# Patient Record
Sex: Male | Born: 1935 | Race: White | Hispanic: No | Smoking: Never smoker
Health system: Southern US, Community
[De-identification: ages and names within clinical notes are randomized; demographics above are authoritative.]

## PROBLEM LIST (undated history)

## (undated) DIAGNOSIS — M1990 Unspecified osteoarthritis, unspecified site: Secondary | ICD-10-CM

## (undated) DIAGNOSIS — H9190 Unspecified hearing loss, unspecified ear: Secondary | ICD-10-CM

## (undated) DIAGNOSIS — E785 Hyperlipidemia, unspecified: Secondary | ICD-10-CM

## (undated) DIAGNOSIS — I350 Nonrheumatic aortic (valve) stenosis: Secondary | ICD-10-CM

## (undated) DIAGNOSIS — I10 Essential (primary) hypertension: Secondary | ICD-10-CM

## (undated) DIAGNOSIS — D649 Anemia, unspecified: Secondary | ICD-10-CM

## (undated) DIAGNOSIS — N289 Disorder of kidney and ureter, unspecified: Secondary | ICD-10-CM

## (undated) DIAGNOSIS — E119 Type 2 diabetes mellitus without complications: Secondary | ICD-10-CM

## (undated) DIAGNOSIS — F0390 Unspecified dementia without behavioral disturbance: Secondary | ICD-10-CM

## (undated) HISTORY — PX: TONSILLECTOMY: SUR1361

## (undated) HISTORY — DX: Type 2 diabetes mellitus without complications: E11.9

## (undated) HISTORY — DX: Anemia, unspecified: D64.9

## (undated) HISTORY — DX: Unspecified osteoarthritis, unspecified site: M19.90

## (undated) HISTORY — DX: Nonrheumatic aortic (valve) stenosis: I35.0

## (undated) HISTORY — PX: KNEE SURGERY: SHX244

## (undated) HISTORY — PX: BACK SURGERY: SHX140

## (undated) HISTORY — DX: Hyperlipidemia, unspecified: E78.5

## (undated) HISTORY — DX: Disorder of kidney and ureter, unspecified: N28.9

## (undated) HISTORY — DX: Essential (primary) hypertension: I10

---

## 1999-11-20 ENCOUNTER — Other Ambulatory Visit: Admission: RE | Admit: 1999-11-20 | Discharge: 1999-11-20 | Payer: Self-pay | Admitting: Urology

## 2000-09-16 ENCOUNTER — Ambulatory Visit (HOSPITAL_COMMUNITY): Admission: RE | Admit: 2000-09-16 | Discharge: 2000-09-16 | Payer: Self-pay | Admitting: Family Medicine

## 2003-11-15 ENCOUNTER — Emergency Department (HOSPITAL_COMMUNITY): Admission: AD | Admit: 2003-11-15 | Discharge: 2003-11-15 | Payer: Self-pay | Admitting: Internal Medicine

## 2006-10-27 ENCOUNTER — Ambulatory Visit: Payer: Self-pay

## 2006-10-27 ENCOUNTER — Encounter: Payer: Self-pay | Admitting: Cardiology

## 2007-01-19 ENCOUNTER — Ambulatory Visit: Payer: Self-pay

## 2007-04-26 ENCOUNTER — Encounter: Admission: RE | Admit: 2007-04-26 | Discharge: 2007-04-26 | Payer: Self-pay | Admitting: Internal Medicine

## 2008-04-29 ENCOUNTER — Encounter (INDEPENDENT_AMBULATORY_CARE_PROVIDER_SITE_OTHER): Payer: Self-pay | Admitting: Internal Medicine

## 2008-04-29 ENCOUNTER — Ambulatory Visit: Payer: Self-pay

## 2008-06-05 ENCOUNTER — Ambulatory Visit: Payer: Self-pay | Admitting: Cardiology

## 2008-11-20 ENCOUNTER — Ambulatory Visit: Payer: Self-pay | Admitting: Cardiology

## 2008-11-20 DIAGNOSIS — I10 Essential (primary) hypertension: Secondary | ICD-10-CM | POA: Insufficient documentation

## 2008-11-20 DIAGNOSIS — E7800 Pure hypercholesterolemia, unspecified: Secondary | ICD-10-CM

## 2008-11-20 DIAGNOSIS — I359 Nonrheumatic aortic valve disorder, unspecified: Secondary | ICD-10-CM | POA: Insufficient documentation

## 2008-11-27 ENCOUNTER — Ambulatory Visit: Payer: Self-pay

## 2008-11-27 ENCOUNTER — Encounter: Payer: Self-pay | Admitting: Cardiology

## 2009-03-31 ENCOUNTER — Encounter (INDEPENDENT_AMBULATORY_CARE_PROVIDER_SITE_OTHER): Payer: Self-pay | Admitting: *Deleted

## 2009-06-10 ENCOUNTER — Encounter: Payer: Self-pay | Admitting: Cardiology

## 2009-06-10 ENCOUNTER — Ambulatory Visit: Payer: Self-pay | Admitting: Cardiology

## 2009-06-10 ENCOUNTER — Ambulatory Visit: Payer: Self-pay

## 2009-06-10 DIAGNOSIS — E119 Type 2 diabetes mellitus without complications: Secondary | ICD-10-CM | POA: Insufficient documentation

## 2009-08-01 ENCOUNTER — Ambulatory Visit: Payer: Self-pay | Admitting: Cardiology

## 2009-08-04 ENCOUNTER — Telehealth: Payer: Self-pay | Admitting: Cardiology

## 2009-08-06 ENCOUNTER — Encounter (INDEPENDENT_AMBULATORY_CARE_PROVIDER_SITE_OTHER): Payer: Self-pay | Admitting: *Deleted

## 2009-08-07 ENCOUNTER — Ambulatory Visit: Payer: Self-pay | Admitting: Cardiology

## 2009-08-07 DIAGNOSIS — I2510 Atherosclerotic heart disease of native coronary artery without angina pectoris: Secondary | ICD-10-CM

## 2009-08-07 DIAGNOSIS — I011 Acute rheumatic endocarditis: Secondary | ICD-10-CM | POA: Insufficient documentation

## 2009-08-07 DIAGNOSIS — D66 Hereditary factor VIII deficiency: Secondary | ICD-10-CM

## 2009-08-08 ENCOUNTER — Inpatient Hospital Stay (HOSPITAL_BASED_OUTPATIENT_CLINIC_OR_DEPARTMENT_OTHER): Admission: RE | Admit: 2009-08-08 | Discharge: 2009-08-08 | Payer: Self-pay | Admitting: Cardiovascular Disease

## 2009-08-08 ENCOUNTER — Ambulatory Visit: Payer: Self-pay | Admitting: Cardiovascular Disease

## 2009-08-08 LAB — CONVERTED CEMR LAB
BUN: 25 mg/dL — ABNORMAL HIGH (ref 6–23)
Basophils Absolute: 0 10*3/uL (ref 0.0–0.1)
Basophils Relative: 0.5 % (ref 0.0–3.0)
CO2: 25 meq/L (ref 19–32)
Calcium: 9.2 mg/dL (ref 8.4–10.5)
Chloride: 107 meq/L (ref 96–112)
Creatinine, Ser: 2 mg/dL — ABNORMAL HIGH (ref 0.4–1.5)
Eosinophils Absolute: 0.4 10*3/uL (ref 0.0–0.7)
Eosinophils Relative: 4.7 % (ref 0.0–5.0)
GFR calc non Af Amer: 34.05 mL/min (ref >60)
Glucose, Bld: 109 mg/dL — ABNORMAL HIGH (ref 70–99)
HCT: 36.4 % — ABNORMAL LOW (ref 39.0–52.0)
Hemoglobin: 12.7 g/dL — ABNORMAL LOW (ref 13.0–17.0)
INR: 1 (ref 0.8–1.0)
Lymphocytes Relative: 20.1 % (ref 12.0–46.0)
Lymphs Abs: 1.5 10*3/uL (ref 0.7–4.0)
MCHC: 34.9 g/dL (ref 30.0–36.0)
MCV: 89.2 fL (ref 78.0–100.0)
Monocytes Absolute: 0.9 10*3/uL (ref 0.1–1.0)
Monocytes Relative: 11.3 % (ref 3.0–12.0)
Neutro Abs: 4.9 10*3/uL (ref 1.4–7.7)
Neutrophils Relative %: 63.4 % (ref 43.0–77.0)
Platelets: 170 10*3/uL (ref 150.0–400.0)
Potassium: 4.9 meq/L (ref 3.5–5.1)
Prothrombin Time: 10.5 s (ref 9.1–11.7)
RBC: 4.08 M/uL — ABNORMAL LOW (ref 4.22–5.81)
RDW: 12.8 % (ref 11.5–14.6)
Sodium: 138 meq/L (ref 135–145)
WBC: 7.7 10*3/uL (ref 4.5–10.5)

## 2009-08-11 ENCOUNTER — Encounter (INDEPENDENT_AMBULATORY_CARE_PROVIDER_SITE_OTHER): Payer: Self-pay | Admitting: *Deleted

## 2009-08-11 ENCOUNTER — Telehealth: Payer: Self-pay | Admitting: Cardiology

## 2009-08-11 ENCOUNTER — Ambulatory Visit: Payer: Self-pay | Admitting: Cardiology

## 2009-08-11 LAB — CONVERTED CEMR LAB
BUN: 24 mg/dL — ABNORMAL HIGH (ref 6–23)
CO2: 27 meq/L (ref 19–32)
Calcium: 9.1 mg/dL (ref 8.4–10.5)
Chloride: 110 meq/L (ref 96–112)
Creatinine, Ser: 1.7 mg/dL — ABNORMAL HIGH (ref 0.4–1.5)
GFR calc non Af Amer: 41.07 mL/min (ref >60)
Glucose, Bld: 115 mg/dL — ABNORMAL HIGH (ref 70–99)
Potassium: 4.6 meq/L (ref 3.5–5.1)
Sodium: 140 meq/L (ref 135–145)

## 2009-08-14 ENCOUNTER — Ambulatory Visit: Payer: Self-pay | Admitting: Cardiology

## 2009-08-14 DIAGNOSIS — N259 Disorder resulting from impaired renal tubular function, unspecified: Secondary | ICD-10-CM | POA: Insufficient documentation

## 2009-08-25 ENCOUNTER — Telehealth (INDEPENDENT_AMBULATORY_CARE_PROVIDER_SITE_OTHER): Payer: Self-pay | Admitting: *Deleted

## 2009-08-25 ENCOUNTER — Encounter: Payer: Self-pay | Admitting: Cardiology

## 2009-08-25 ENCOUNTER — Ambulatory Visit: Payer: Self-pay | Admitting: Thoracic Surgery (Cardiothoracic Vascular Surgery)

## 2009-08-26 ENCOUNTER — Encounter: Admission: AD | Admit: 2009-08-26 | Discharge: 2009-08-26 | Payer: Self-pay | Admitting: Dentistry

## 2009-08-26 ENCOUNTER — Encounter: Payer: Self-pay | Admitting: Cardiology

## 2009-08-26 ENCOUNTER — Telehealth (INDEPENDENT_AMBULATORY_CARE_PROVIDER_SITE_OTHER): Payer: Self-pay | Admitting: *Deleted

## 2009-08-26 ENCOUNTER — Ambulatory Visit: Payer: Self-pay | Admitting: Dentistry

## 2009-08-28 ENCOUNTER — Telehealth: Payer: Self-pay | Admitting: Cardiology

## 2009-09-05 ENCOUNTER — Telehealth: Payer: Self-pay | Admitting: Cardiology

## 2009-09-15 ENCOUNTER — Other Ambulatory Visit: Payer: Self-pay | Admitting: Thoracic Surgery (Cardiothoracic Vascular Surgery)

## 2009-09-15 ENCOUNTER — Ambulatory Visit: Payer: Self-pay | Admitting: Thoracic Surgery (Cardiothoracic Vascular Surgery)

## 2009-09-15 ENCOUNTER — Encounter: Payer: Self-pay | Admitting: Thoracic Surgery (Cardiothoracic Vascular Surgery)

## 2009-09-15 ENCOUNTER — Encounter: Payer: Self-pay | Admitting: Cardiology

## 2009-09-15 HISTORY — PX: AORTIC VALVE REPLACEMENT: SHX41

## 2009-09-16 ENCOUNTER — Inpatient Hospital Stay (HOSPITAL_COMMUNITY)
Admission: RE | Admit: 2009-09-16 | Discharge: 2009-09-21 | Payer: Self-pay | Admitting: Thoracic Surgery (Cardiothoracic Vascular Surgery)

## 2009-09-16 ENCOUNTER — Ambulatory Visit: Payer: Self-pay | Admitting: Cardiology

## 2009-09-16 ENCOUNTER — Ambulatory Visit: Payer: Self-pay | Admitting: Thoracic Surgery (Cardiothoracic Vascular Surgery)

## 2009-10-01 ENCOUNTER — Encounter: Payer: Self-pay | Admitting: Cardiology

## 2009-10-07 ENCOUNTER — Encounter: Payer: Self-pay | Admitting: Cardiology

## 2009-10-07 ENCOUNTER — Ambulatory Visit: Payer: Self-pay | Admitting: Cardiovascular Disease

## 2009-10-07 DIAGNOSIS — I4891 Unspecified atrial fibrillation: Secondary | ICD-10-CM

## 2009-10-10 LAB — CONVERTED CEMR LAB
BUN: 23 mg/dL (ref 6–23)
CO2: 27 meq/L (ref 19–32)
Calcium: 9.4 mg/dL (ref 8.4–10.5)
Chloride: 105 meq/L (ref 96–112)
Creatinine, Ser: 1.8 mg/dL — ABNORMAL HIGH (ref 0.4–1.5)
GFR calc non Af Amer: 38.43 mL/min (ref >60)
Glucose, Bld: 105 mg/dL — ABNORMAL HIGH (ref 70–99)
Potassium: 5.1 meq/L (ref 3.5–5.1)
Sodium: 138 meq/L (ref 135–145)
TSH: 1.36 microintl units/mL (ref 0.35–5.50)

## 2009-10-13 ENCOUNTER — Encounter
Admission: RE | Admit: 2009-10-13 | Discharge: 2009-10-13 | Payer: Self-pay | Admitting: Thoracic Surgery (Cardiothoracic Vascular Surgery)

## 2009-10-13 ENCOUNTER — Ambulatory Visit: Payer: Self-pay | Admitting: Thoracic Surgery (Cardiothoracic Vascular Surgery)

## 2009-10-13 ENCOUNTER — Encounter: Payer: Self-pay | Admitting: Cardiology

## 2009-10-17 ENCOUNTER — Telehealth: Payer: Self-pay | Admitting: Cardiology

## 2009-10-17 ENCOUNTER — Ambulatory Visit: Payer: Self-pay | Admitting: Cardiovascular Disease

## 2009-10-17 LAB — CONVERTED CEMR LAB: POC INR: 1.3

## 2009-10-24 ENCOUNTER — Ambulatory Visit: Payer: Self-pay | Admitting: Cardiology

## 2009-10-24 LAB — CONVERTED CEMR LAB: POC INR: 2.2

## 2009-10-31 ENCOUNTER — Ambulatory Visit: Payer: Self-pay | Admitting: Cardiology

## 2009-10-31 LAB — CONVERTED CEMR LAB: POC INR: 2

## 2009-11-06 ENCOUNTER — Telehealth (INDEPENDENT_AMBULATORY_CARE_PROVIDER_SITE_OTHER): Payer: Self-pay | Admitting: *Deleted

## 2009-11-06 ENCOUNTER — Ambulatory Visit: Payer: Self-pay | Admitting: Cardiology

## 2009-11-12 ENCOUNTER — Ambulatory Visit: Payer: Self-pay

## 2009-11-12 ENCOUNTER — Ambulatory Visit (HOSPITAL_COMMUNITY): Admission: RE | Admit: 2009-11-12 | Discharge: 2009-11-12 | Payer: Self-pay | Admitting: Cardiovascular Disease

## 2009-11-12 ENCOUNTER — Encounter: Payer: Self-pay | Admitting: Cardiology

## 2009-11-12 ENCOUNTER — Ambulatory Visit: Payer: Self-pay | Admitting: Cardiovascular Disease

## 2009-11-14 ENCOUNTER — Encounter: Payer: Self-pay | Admitting: Cardiology

## 2009-11-21 ENCOUNTER — Ambulatory Visit: Payer: Self-pay | Admitting: Cardiovascular Disease

## 2009-11-21 LAB — CONVERTED CEMR LAB: POC INR: 3.2

## 2009-11-24 ENCOUNTER — Encounter: Payer: Self-pay | Admitting: Cardiology

## 2009-11-24 ENCOUNTER — Ambulatory Visit: Payer: Self-pay | Admitting: Thoracic Surgery (Cardiothoracic Vascular Surgery)

## 2009-12-09 ENCOUNTER — Ambulatory Visit: Payer: Self-pay | Admitting: Cardiovascular Disease

## 2009-12-09 LAB — CONVERTED CEMR LAB: POC INR: 3.6

## 2009-12-23 ENCOUNTER — Ambulatory Visit: Payer: Self-pay | Admitting: Cardiology

## 2009-12-23 LAB — CONVERTED CEMR LAB: POC INR: 3.6

## 2010-01-06 ENCOUNTER — Ambulatory Visit: Payer: Self-pay | Admitting: Cardiology

## 2010-01-06 LAB — CONVERTED CEMR LAB: POC INR: 2.9

## 2010-01-15 ENCOUNTER — Encounter (INDEPENDENT_AMBULATORY_CARE_PROVIDER_SITE_OTHER): Payer: Self-pay | Admitting: *Deleted

## 2010-01-15 ENCOUNTER — Ambulatory Visit: Payer: Self-pay | Admitting: Cardiology

## 2010-01-15 DIAGNOSIS — Z954 Presence of other heart-valve replacement: Secondary | ICD-10-CM | POA: Insufficient documentation

## 2010-01-19 ENCOUNTER — Encounter (INDEPENDENT_AMBULATORY_CARE_PROVIDER_SITE_OTHER): Payer: Self-pay | Admitting: Cardiology

## 2010-01-19 ENCOUNTER — Ambulatory Visit: Payer: Self-pay | Admitting: Internal Medicine

## 2010-01-19 LAB — CONVERTED CEMR LAB: POC INR: 2.4

## 2010-01-20 ENCOUNTER — Ambulatory Visit (HOSPITAL_COMMUNITY): Admission: RE | Admit: 2010-01-20 | Discharge: 2010-01-20 | Payer: Self-pay | Admitting: Cardiovascular Disease

## 2010-01-20 ENCOUNTER — Ambulatory Visit: Payer: Self-pay | Admitting: Cardiovascular Disease

## 2010-01-23 LAB — CONVERTED CEMR LAB
BUN: 19 mg/dL (ref 6–23)
Basophils Absolute: 0 10*3/uL (ref 0.0–0.1)
Basophils Relative: 0.5 % (ref 0.0–3.0)
CO2: 26 meq/L (ref 19–32)
Calcium: 9.2 mg/dL (ref 8.4–10.5)
Chloride: 108 meq/L (ref 96–112)
Creatinine, Ser: 1.7 mg/dL — ABNORMAL HIGH (ref 0.4–1.5)
Eosinophils Absolute: 0.5 10*3/uL (ref 0.0–0.7)
Eosinophils Relative: 7.1 % — ABNORMAL HIGH (ref 0.0–5.0)
GFR calc non Af Amer: 41.03 mL/min (ref >60)
Glucose, Bld: 159 mg/dL — ABNORMAL HIGH (ref 70–99)
HCT: 39.4 % (ref 39.0–52.0)
Hemoglobin: 12.8 g/dL — ABNORMAL LOW (ref 13.0–17.0)
Lymphocytes Relative: 17.8 % (ref 12.0–46.0)
Lymphs Abs: 1.4 10*3/uL (ref 0.7–4.0)
MCHC: 32.6 g/dL (ref 30.0–36.0)
MCV: 85.5 fL (ref 78.0–100.0)
Monocytes Absolute: 1 10*3/uL (ref 0.1–1.0)
Monocytes Relative: 13.3 % — ABNORMAL HIGH (ref 3.0–12.0)
Neutro Abs: 4.7 10*3/uL (ref 1.4–7.7)
Neutrophils Relative %: 61.3 % (ref 43.0–77.0)
Platelets: 164 10*3/uL (ref 150.0–400.0)
Potassium: 4.4 meq/L (ref 3.5–5.1)
RBC: 4.61 M/uL (ref 4.22–5.81)
RDW: 16.7 % — ABNORMAL HIGH (ref 11.5–14.6)
Sodium: 137 meq/L (ref 135–145)
WBC: 7.6 10*3/uL (ref 4.5–10.5)

## 2010-02-09 ENCOUNTER — Ambulatory Visit: Payer: Self-pay | Admitting: Internal Medicine

## 2010-02-09 LAB — CONVERTED CEMR LAB: POC INR: 3.4

## 2010-02-27 ENCOUNTER — Encounter: Payer: Self-pay | Admitting: Cardiology

## 2010-03-02 ENCOUNTER — Ambulatory Visit: Payer: Self-pay | Admitting: Cardiology

## 2010-03-02 LAB — CONVERTED CEMR LAB: POC INR: 3.8

## 2010-03-04 ENCOUNTER — Ambulatory Visit: Payer: Self-pay | Admitting: Cardiology

## 2010-03-05 LAB — CONVERTED CEMR LAB
ALT: 16 units/L (ref 0–53)
AST: 23 units/L (ref 0–37)
Albumin: 4.3 g/dL (ref 3.5–5.2)
Alkaline Phosphatase: 81 units/L (ref 39–117)
BUN: 19 mg/dL (ref 6–23)
Bilirubin, Direct: 0.1 mg/dL (ref 0.0–0.3)
CO2: 26 meq/L (ref 19–32)
Calcium: 9.2 mg/dL (ref 8.4–10.5)
Chloride: 107 meq/L (ref 96–112)
Cholesterol: 150 mg/dL (ref 0–200)
Creatinine, Ser: 1.7 mg/dL — ABNORMAL HIGH (ref 0.4–1.5)
GFR calc non Af Amer: 41.01 mL/min (ref >60)
Glucose, Bld: 140 mg/dL — ABNORMAL HIGH (ref 70–99)
HDL: 82 mg/dL (ref >39.00)
LDL Cholesterol: 53 mg/dL (ref 0–99)
Potassium: 4.6 meq/L (ref 3.5–5.1)
Sodium: 141 meq/L (ref 135–145)
Total Bilirubin: 0.6 mg/dL (ref 0.3–1.2)
Total CHOL/HDL Ratio: 2
Total Protein: 7.2 g/dL (ref 6.0–8.3)
Triglycerides: 75 mg/dL (ref 0.0–149.0)
VLDL: 15 mg/dL (ref 0.0–40.0)

## 2010-04-07 ENCOUNTER — Encounter: Payer: Self-pay | Admitting: Internal Medicine

## 2010-08-27 ENCOUNTER — Ambulatory Visit: Payer: Self-pay | Admitting: Cardiology

## 2010-10-05 ENCOUNTER — Ambulatory Visit: Payer: Self-pay | Admitting: Cardiology

## 2010-10-06 ENCOUNTER — Encounter (INDEPENDENT_AMBULATORY_CARE_PROVIDER_SITE_OTHER): Payer: Self-pay | Admitting: *Deleted

## 2010-10-06 LAB — CONVERTED CEMR LAB
ALT: 18 units/L (ref 0–53)
AST: 20 units/L (ref 0–37)
Albumin: 3.6 g/dL (ref 3.5–5.2)
Alkaline Phosphatase: 74 units/L (ref 39–117)
BUN: 22 mg/dL (ref 6–23)
Bilirubin, Direct: 0.1 mg/dL (ref 0.0–0.3)
CO2: 25 meq/L (ref 19–32)
Calcium: 8.8 mg/dL (ref 8.4–10.5)
Chloride: 102 meq/L (ref 96–112)
Cholesterol: 151 mg/dL (ref 0–200)
Creatinine, Ser: 1.5 mg/dL (ref 0.4–1.5)
GFR calc non Af Amer: 47.32 mL/min (ref >60)
Glucose, Bld: 143 mg/dL — ABNORMAL HIGH (ref 70–99)
HDL: 61.1 mg/dL (ref >39.00)
LDL Cholesterol: 80 mg/dL (ref 0–99)
Potassium: 4.2 meq/L (ref 3.5–5.1)
Sodium: 136 meq/L (ref 135–145)
Total Bilirubin: 0.5 mg/dL (ref 0.3–1.2)
Total CHOL/HDL Ratio: 2
Total Protein: 6.1 g/dL (ref 6.0–8.3)
Triglycerides: 52 mg/dL (ref 0.0–149.0)
VLDL: 10.4 mg/dL (ref 0.0–40.0)

## 2010-12-17 NOTE — Letter (Signed)
Summary: Custom - Lipid  [facility], Main Office  [address]   [place], [place] [address]   Phone: [phone]  Fax: [phone]     October 06, 2010 MRN: [id number]   [facility] [address] [place], [place]  [address]   Dear [name],  We have reviewed your cholesterol results.  They are as follows:     Total Cholesterol:    151 (Desirable: less than 200)       HDL  Cholesterol:     61.10  (Desirable: greater than 40 for men and 50 for women)       LDL Cholesterol:       80  (Desirable: less than 100 for low risk and less than 70 for moderate to high risk)       Triglycerides:       52.0  (Desirable: less than 150)  Our recommendations include:These numbers look good. Continue on the same medicine. Sodium, potassium, kidney and  Liver function are normal. Take care, Dr. Darel Hong.    Call our office at the number listed above if you have any questions.  Lowering your LDL cholesterol is important, but it is only one of a large number of "risk factors" that may indicate that you are at risk for heart disease, stroke or other complications of hardening of the arteries.  Other risk factors include:   A.  Cigarette Smoking* B.  High Blood Pressure* C.  Obesity* D.   Low HDL Cholesterol (see yours above)* E.   Diabetes Mellitus (higher risk if your is uncontrolled) F.  Family history of premature heart disease G.  Previous history of stroke or cardiovascular disease    *These are risk factors YOU HAVE CONTROL OVER.  For more information, visit .  There is now evidence that lowering the TOTAL CHOLESTEROL AND LDL CHOLESTEROL can reduce the risk of heart disease.  The American Heart Association recommends the following guidelines for the treatment of elevated cholesterol:  1.  If there is now current heart disease and less than two risk factors, TOTAL CHOLESTEROL should be less than 200 and LDL CHOLESTEROL should be less than 100. 2.   If there is current heart disease or two or more risk factors, TOTAL CHOLESTEROL should be less than 200 and LDL CHOLESTEROL should be less than 70.  A diet low in cholesterol, saturated fat, and calories is the cornerstone of treatment for elevated cholesterol.  Cessation of smoking and exercise are also important in the management of elevated cholesterol and preventing vascular disease.  Studies have shown that 30 to 60 minutes of physical activity most days can help lower blood pressure, lower cholesterol, and keep your weight at a healthy level.  Drug therapy is used when cholesterol levels do not respond to therapeutic lifestyle changes (smoking cessation, diet, and exercise) and remains unacceptably high.  If medication is started, it is important to have you levels checked periodically to evaluate the need for further treatment options.  Thank you,    [employer] Team

## 2010-12-17 NOTE — Medication Information (Signed)
Summary: rov/eh  Anticoagulant Therapy  Managed by: Leota Sauers, PharmD Referring MD: Dr Olga Millers PCP: Dr Joretta Bachelor MD: Eden Emms MD, Theron Arista Indication 1: Atrial Fibrillation Indication 2: aortic valve replacement Valve Type: Carpentier-Edwards Valve Position: Aortic Lab Used: [facility] [place] Site: [address] INR POC 3.6 INR RANGE 2.0-3.0  Dietary changes: no    Health status changes: no    Bleeding/hemorrhagic complications: no    Recent/future hospitalizations: no    Any changes in medication regimen? no    Recent/future dental: no  Any missed doses?: no       Is patient compliant with meds? yes       Allergies: No Known Drug Allergies  Anticoagulation Management History:      The patient is taking warfarin and comes in today for a routine follow up visit.  Positive risk factors for bleeding include an age of 75 years or older and presence of serious comorbidities.  The bleeding index is 'intermediate risk'.  Positive CHADS2 values include History of HTN, Age > 31 years old, and History of Diabetes.  His last INR was 1.0 ratio.  Anticoagulation responsible provider: Eden Emms MD, Theron Arista.  INR POC: 3.6.  Cuvette Lot#: [id number].  Exp: 02/2011.    Anticoagulation Management Assessment/Plan:      The patient's current anticoagulation dose is Coumadin 5 mg tabs: take as directed.  The target INR is 2.0-3.0.  The next INR is due 12/23/2009.  Results were reviewed/authorized by Leota Sauers, PharmD.  He was notified by Lew Dawes, PharmD Candidate.         Prior Anticoagulation Instructions: INR 3.2  Take 1/2 tablet today then resume normal dose of 1 tablet daily except 1.5 tablets Mondays and Fridays. Recheck 1/25.   Current Anticoagulation Instructions: INR 3.6  Skip today's dose then change dose to 1 tablet daily except 1.5 tablets on Mondays. Recheck in 2 weeks.

## 2010-12-17 NOTE — Assessment & Plan Note (Signed)
Summary: [name]   Primary Provider:  Dr Timothy Lasso   History of Present Illness: [name] is a pleasant gentleman  who has a history of aortic stenosis. An echocardiogram was performed on June 10, 2009. It revealed normal LV function and severe aortic stenosis with a mean gradient of 57 mm per mercury. The aortic root was mildly dilated. There was mild left atrial enlargement. We therefore felt that aortic valve replacement was indicated. He underwent cardiac catheterization on August 08, 2009. The coronaries were normal. The aortic valve was calcified. By hand injection the aortic root did not appear to be dilated. The patient also has renal insufficiency. The patient subsequently underwent aortic valve replacement with a pericardial tissue valve on September 16, 2009. Followup echocardiogram in December of 2010  revealed normal LV function and a well-functioning aortic valve with a mean gradient of 6 mmHg, mild to moderate mitral regurgitation, mild biatrial enlargement, and mild right ventricular enlargement. The patient also developed postoperative atrial fibrillation. He underwent elective cardioversion on January 20, 2010. I last saw him in April of 2011. Since then, the patient denies any dyspnea on exertion, orthopnea, PND, pedal edema, palpitations, syncope or chest pain.   Current Medications (verified): 1)  Simvastatin 20 Mg Tabs (Simvastatin) .... Take One Tablet By Mouth Daily At Bedtime 2)  Aspirin 81 Mg Tbec (Aspirin) .... Take One Tablet By Mouth Daily 3)  Metoprolol Tartrate 25 Mg Tabs (Metoprolol Tartrate) .Marland Kitchen.. 1 Ppo Daily 4)  Amlodipine Besylate 5 Mg Tabs (Amlodipine Besylate) .... Take One Tablet By Mouth Daily  Allergies (verified): No Known Drug Allergies  Past History:  Past Medical History: Post operative atrial fibrillation Hyperlipidemia Hypertension Arthritis Aortic stenosis      - s/p  #25 Edwards magna pericardial  tissue valve - 11.2.2010 Cornelius Moras) H/O DM Renal  insufficiency  Past Surgical History: Reviewed history from 11/06/2009 and no changes required. Back surgery L knee surgery tonsillectomy Status post aortic valve replacement November 2010 with pericardial tissue valve.  Social History: Reviewed history from 11/20/2008 and no changes required. Tobacco Use - No.  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 Remaining systems are negative.   Vital Signs:  Patient profile:   77 year old male Height:      70 inches Weight:      238 pounds BMI:     34.27 Pulse rate:   58 / minute Resp:     18 per minute BP sitting:   156 / 75  (right arm)  Vitals Entered By: Marrion Coy, CNA (August 27, 2010 8:50 AM)  Physical Exam  General:  Well-developed well-nourished in no acute distress.  Skin is warm and dry.  HEENT is normal.  Neck is supple. No thyromegaly.  Chest is clear to auscultation with normal expansion.  Cardiovascular exam is regular rate and rhythm. no diastolic murmur Abdominal exam nontender or distended. No masses palpated. Extremities show no edema. neuro grossly intact    EKG  Procedure date:  08/27/2010  Findings:      Sinus rhythm at a rate of 58. Occasional PACs. Left bundle branch block.  Impression & Recommendations:  Problem # 1:  AORTIC VALVE REPLACEMENT, HX OF (ICD-V43.3) Continue SBE prophylaxis. Repeat echocardiogram when he returns in one year.  Problem # 2:  RENAL INSUFFICIENCY (  ICD-588.9) Repeat potassium and renal function.  Problem # 3:  HYPERTENSION, BENIGN (ICD-401.1) Blood pressure elevated. Increase amlodipine to 10 mg p.o. daily. His updated medication list for this problem includes:    Aspirin 81 Mg Tbec (Aspirin) .Marland Kitchen... Take one tablet by mouth daily    Metoprolol  Tartrate 25 Mg Tabs (Metoprolol tartrate) .Marland Kitchen... 1 ppo daily    Amlodipine Besylate 10 Mg Tabs (Amlodipine besylate) .Marland Kitchen... Take one tablet by mouth daily  His updated medication list for this problem includes:    Aspirin 81 Mg Tbec (Aspirin) .Marland Kitchen... Take one tablet by mouth daily    Metoprolol Tartrate 25 Mg Tabs (Metoprolol tartrate) .Marland Kitchen... 1 ppo daily    Amlodipine Besylate 5 Mg Tabs (Amlodipine besylate) .Marland Kitchen... Take one tablet by mouth daily  Problem # 4:  HYPERCHOLESTEROLEMIA, PURE (ICD-272.0) Discontinue Zocor given amlodipine interaction. Begin Pravachol 40 mg p.o. daily. Check lipids and liver in 6 weeks. His updated medication list for this problem includes:    Pravastatin Sodium 40 Mg Tabs (Pravastatin sodium) .Marland Kitchen... Take one tablet by mouth daily at bedtime  Problem # 5:  DM (ICD-250.00)  His updated medication list for this problem includes:    Aspirin 81 Mg Tbec (Aspirin) .Marland Kitchen... Take one tablet by mouth daily  His updated medication list for this problem includes:    Aspirin 81 Mg Tbec (Aspirin) .Marland Kitchen... Take one tablet by mouth daily  Other Orders: EKG w/ Interpretation (93000)  Patient Instructions: 1)  Stop Zocor. 2)  Start Pravastatin 40mg  by mouth once daily . 3)  Increase Amlodipine to 10mg  by mouth once daily. 4)  FASTING labwork in 6 weeks: lipid/liver/bmet (424.1;401.1;272.0). 5)  Your physician wants you to follow-up in: 1 year.  You will receive a reminder letter in the mail two months in advance. If you don't receive a letter, please call our office to schedule the follow-up appointment.

## 2010-12-17 NOTE — Medication Information (Signed)
Summary: rov.mp  Anticoagulant Therapy  Managed by: Bethena Midget, RN Referring MD: Dr Olga Millers PCP: Dr Joretta Bachelor MD: Eden Emms MD, Theron Arista Indication 1: Atrial Fibrillation Indication 2: aortic valve replacement Valve Type: Carpentier-Edwards Valve Position: Aortic Lab Used: [facility] [place] Site: [address] INR POC 3.2 INR RANGE 2.0-3.0  Dietary changes: no    Health status changes: no    Bleeding/hemorrhagic complications: no    Recent/future hospitalizations: no    Any changes in medication regimen? no    Recent/future dental: no  Any missed doses?: yes     Details: Says he may have missed one dose but unsure of when.  Is patient compliant with meds? yes       Allergies: No Known Drug Allergies  Anticoagulation Management History:      The patient is taking warfarin and comes in today for a routine follow up visit.  Positive risk factors for bleeding include an age of 75 years or older and presence of serious comorbidities.  The bleeding index is 'intermediate risk'.  Positive CHADS2 values include History of HTN, Age > 6 years old, and History of Diabetes.  His last INR was 1.0 ratio.  Anticoagulation responsible provider: Eden Emms MD, Theron Arista.  INR POC: 3.2.  Cuvette Lot#: [id number].  Exp: 12/2010.    Anticoagulation Management Assessment/Plan:      The patient's current anticoagulation dose is Coumadin 5 mg tabs: take as directed.  The target INR is 2.0-3.0.  The next INR is due 12/09/2009.  Results were reviewed/authorized by Bethena Midget, RN.  He was notified by Lew Dawes, PharmD Candidate.         Prior Anticoagulation Instructions: INR 2.0  CONTINUE TO TAKE 1.5 TABLETS MONDAY AND FRIDAY AND 1 TABLET THE REST OF THE DAYS.   RECHECK IN 4 WEEKS.  Current Anticoagulation Instructions: INR 3.2  Take 1/2 tablet today then resume normal dose of 1 tablet daily except 1.5 tablets Mondays and Fridays. Recheck 1/25.

## 2010-12-17 NOTE — Medication Information (Signed)
Summary: Coumadin Clinic  Anticoagulant Therapy  Managed by: Inactive Referring MD: Dr Olga Millers PCP: Dr Joretta Bachelor MD: Shirlee Latch MD, Freida Busman Indication 1: Atrial Fibrillation Indication 2: aortic valve replacement Valve Type: Carpentier-Edwards Valve Position: Aortic Lab Used: [facility] [place] Site: [address] INR RANGE 2.0-3.0          Comments: Dr. Jens Som d/c'ed Coumadin and changed to ASA  Allergies: No Known Drug Allergies  Anticoagulation Management History:      Positive risk factors for bleeding include an age of 75 years or older and presence of serious comorbidities.  The bleeding index is 'intermediate risk'.  Positive CHADS2 values include History of HTN, Age > 42 years old, and History of Diabetes.  His last INR was 1.0 ratio.  Anticoagulation responsible provider: Shirlee Latch MD, [place].  Exp: 03/2011.    Anticoagulation Management Assessment/Plan:      The target INR is 2.0-3.0.  The next INR is due 03/13/2010.  Anticoagulation instructions were given to patient.  Results were reviewed/authorized by Inactive.         Prior Anticoagulation Instructions: INR 3.8  Skip today's dose of Coumadin then decrease dose to 1 tablet every day

## 2010-12-17 NOTE — Assessment & Plan Note (Signed)
Summary: PER [facility] OUT/SF   Primary Provider:  Dr Timothy Lasso   History of Present Illness: [name] is a pleasant gentleman  who has a history of aortic stenosis. An echocardiogram was performed on June 10, 2009. It revealed normal LV function and severe aortic stenosis with a mean gradient of 57 mm per mercury. The aortic root was mildly dilated. There was mild left atrial enlargement. We therefore felt that aortic valve replacement was indicated. He underwent cardiac catheterization on August 08, 2009. The coronaries were normal. The aortic valve was calcified. By hand injection the aortic root did not appear to be dilated. The patient also has renal insufficiency. The patient subsequently underwent aortic valve replacement with a pericardial tissue valve on September 16, 2009. Followup echocardiogram in December of 2002 and revealed normal LV function and a well-functioning aortic valve with a mean gradient of 6 mmHg, mild to moderate mitral regurgitation, mild biatrial enlargement, and mild right ventricular enlargement. The patient also developed postoperative atrial fibrillation. We planned cardioversion once he has been therapeutic for 4 consecutive weeks. I last saw him in December of 2010. Since then the patient denies any dyspnea on exertion, orthopnea, PND, pedal edema, palpitations, syncope or chest pain.   Current Medications (verified): 1)  Simvastatin 20 Mg Tabs (Simvastatin) .... Take One Tablet By Mouth Daily At Bedtime 2)  Aspirin 81 Mg Tbec (Aspirin) .... Take One Tablet By Mouth Daily 3)  Metoprolol Tartrate 25 Mg Tabs (Metoprolol Tartrate) .... Ran Out 4)  Coumadin 5 Mg Tabs (Warfarin Sodium) .... Take As Directed  Allergies: No Known Drug Allergies  Past History:  Past Medical History: Reviewed history from 11/06/2009 and no changes required. A. Fib - dx 11.23.2010 Hyperlipidemia Hypertension Arthritis Aortic stenosis      - s/p  #25 Edwards magna pericardial  tissue  valve - 11.2.2010 Cornelius Moras) H/O DM Renal insufficiency  Past Surgical History: Reviewed history from 11/06/2009 and no changes required. Back surgery L knee surgery tonsillectomy Status post aortic valve replacement November 2010 with pericardial tissue valve.  Social History: Reviewed history from 11/20/2008 and no changes required. Tobacco Use - No.  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 Remaining systems are negative.   Vital Signs:  Patient profile:   77 year old male Height:      70 inches Weight:      231 pounds Pulse rate:   95 / minute Resp:     12 per minute BP sitting:   142 / 70  (left arm)  Vitals Entered By: Kem Parkinson (January 15, 2010 8:50 AM)  Physical Exam  General:  Well-developed well-nourished in no acute distress.  Skin is warm and dry.  HEENT is normal.  Neck is supple. No thyromegaly.  Chest is clear to auscultation with normal expansion.  Cardiovascular exam is Irregular; 1/6 systolic ejection murmur.  Abdominal exam nontender or distended. No masses palpated. Extremities show no edema. neuro grossly intact    EKG  Procedure date:  01/15/2010  Findings:      atrial fibrillation at a rate of 95. Occasional PVCs or aberrantly conducted beats. Cannot rule out prior septal infarct. Lateral T-wave inversion.  Impression & Recommendations:  Problem # 1:  ATRIAL FIBRILLATION (ICD-427.31) The patient remains in atrial fibrillation. Hopefully this is merely postoperative atrial fibrillation.  He is not taking his Lopressor and we resumed this. His INR has been therapeutic. I will schedule him for an outpatient cardioversion after repeating his INR early next week. Proceed assuming it is greater than 2. We potentially could stop his Coumadin in  the future if he has no recurrence of his atrial fibrillation as this may have been related to his recent aortic valve replacement. His updated medication list for this problem includes:    Aspirin 81 Mg Tbec (Aspirin) .Marland Kitchen... Take one tablet by mouth daily    Metoprolol Tartrate 25 Mg Tabs (Metoprolol tartrate) .Marland Kitchen... Ran out    Coumadin 5 Mg Tabs (Warfarin sodium) .Marland Kitchen... Take as directed  Orders: Cardioversion (Cardioversion) TLB-CBC Platelet - w/Differential (85025-CBCD)  Problem # 2:  RENAL INSUFFICIENCY (ICD-588.9) Check bmet. Orders: TLB-BMP (Basic Metabolic Panel-BMET) (80048-METABOL)  Problem # 3:  DM (ICD-250.00) Management per primary care. His updated medication list for this problem includes:    Aspirin 81 Mg Tbec (Aspirin) .Marland Kitchen... Take one tablet by mouth daily  Problem # 4:  HYPERCHOLESTEROLEMIA, PURE (ICD-272.0) Continue statin. His updated medication list for this problem includes:    Simvastatin 20 Mg Tabs (Simvastatin) .Marland Kitchen... Take one tablet by mouth daily at bedtime  Problem # 5:  HYPERTENSION, BENIGN (ICD-401.1) Blood pressure mildly elevated. Resume beta blocker. His updated medication list for this problem includes:    Aspirin 81 Mg Tbec (Aspirin) .Marland Kitchen... Take one tablet by mouth daily    Metoprolol Tartrate 25 Mg Tabs (Metoprolol tartrate) .Marland Kitchen... Ran out  Problem # 6:  AORTIC VALVE REPLACEMENT, HX OF (ICD-V43.3) Continued SBE prophylaxis.  Problem # 7:  COUMADIN THERAPY (ICD-V58.61) Monitored in the Coumadin clinic. Goal INR 2-3.  Patient Instructions: 1)  Your physician recommends that you schedule a follow-up appointment in: 6 weeks 2)  Your physician has recommended that you have a cardioversion (DCCV).  Electrical cardioversion uses a jolt of electricity to your heart either through paddles or wired patches attached to your chest. This is a controlled, usually prescheduled, procedure. Defibrillation is done under light anesthesia in the hospital, and you  usually go home the day of the procedure. This is done to get your heart back into a normal rhythm. You are not awake for the procedure. Please see the instruction sheet given to you today.

## 2010-12-17 NOTE — Medication Information (Signed)
Summary: [name]/pt having cardioversion 01-20-10  Anticoagulant Therapy  Managed by: Shelby Dubin, PharmD, BCPS, CPP Referring MD: Dr Olga Millers PCP: Dr Joretta Bachelor MD: Tenny Craw MD, Gunnar Fusi Indication 1: Atrial Fibrillation Indication 2: aortic valve replacement Valve Type: Carpentier-Edwards Valve Position: Aortic Lab Used: [facility] [place] Site: [address] INR POC 2.4 INR RANGE 2.0-3.0  Dietary changes: no    Health status changes: no    Bleeding/hemorrhagic complications: no    Recent/future hospitalizations: no    Any changes in medication regimen? no    Recent/future dental: no  Any missed doses?: yes     Details: may have missed 1 dose in past week.  Is patient compliant with meds? yes       Allergies (verified): No Known Drug Allergies  Anticoagulation Management History:      The patient is taking warfarin and comes in today for a routine follow up visit.  Positive risk factors for bleeding include an age of 75 years or older and presence of serious comorbidities.  The bleeding index is 'intermediate risk'.  Positive CHADS2 values include History of HTN, Age > 75 years old, and History of Diabetes.  His last INR was 1.0 ratio.  Anticoagulation responsible provider: Tenny Craw MD, Gunnar Fusi.  INR POC: 2.4.  Cuvette Lot#: [id number].  Exp: 03/2011.    Anticoagulation Management Assessment/Plan:      The patient's current anticoagulation dose is Coumadin 5 mg tabs: take as directed.  The target INR is 2.0-3.0.  The next INR is due 02/10/2010.  Results were reviewed/authorized by Shelby Dubin, PharmD, BCPS, CPP.  He was notified by Shelby Dubin PharmD, BCPS, CPP.         Prior Anticoagulation Instructions: INR 2.9  Continue current dosing schedule.  Take 1.5 tablets on Monday and take 1 tablet all other days.  Return to clinic in 3 weeks.    Current Anticoagulation Instructions: INR 2.4  Take 2 tabs TODAY ONLY, then resume 1.5 tabs each Monday and 1 tab  on all other days.  Recheck in 3 weeks.  Good luck tomorrow!

## 2010-12-17 NOTE — Assessment & Plan Note (Signed)
Summary: 8wk f/u ok per debra. no avial appt. slots in 6wks   Primary Provider:  Dr Timothy Lasso   History of Present Illness: [name] is a pleasant gentleman  who has a history of aortic stenosis. An echocardiogram was performed on June 10, 2009. It revealed normal LV function and severe aortic stenosis with a mean gradient of 57 mm per mercury. The aortic root was mildly dilated. There was mild left atrial enlargement. We therefore felt that aortic valve replacement was indicated. He underwent cardiac catheterization on August 08, 2009. The coronaries were normal. The aortic valve was calcified. By hand injection the aortic root did not appear to be dilated. The patient also has renal insufficiency. The patient subsequently underwent aortic valve replacement with a pericardial tissue valve on September 16, 2009. Followup echocardiogram in December of 2010  revealed normal LV function and a well-functioning aortic valve with a mean gradient of 6 mmHg, mild to moderate mitral regurgitation, mild biatrial enlargement, and mild right ventricular enlargement. The patient also developed postoperative atrial fibrillation. He underwent elective cardioversion on January 20, 2010. Since then the patient denies any dyspnea on exertion, orthopnea, PND, pedal edema, palpitations, syncope or chest pain.   Current Medications (verified): 1)  Simvastatin 20 Mg Tabs (Simvastatin) .... Take One Tablet By Mouth Daily At Bedtime 2)  Aspirin 81 Mg Tbec (Aspirin) .... Take One Tablet By Mouth Daily 3)  Metoprolol Tartrate 25 Mg Tabs (Metoprolol Tartrate) .... Ran Out 4)  Coumadin 5 Mg Tabs (Warfarin Sodium) .... Take As Directed  Allergies (verified): No Known Drug Allergies  Past History:  Past Medical History: Reviewed history from 11/06/2009 and no changes required. A. Fib - dx 11.23.2010 Hyperlipidemia Hypertension Arthritis Aortic stenosis      - s/p  #25 Edwards magna pericardial  tissue valve - 11.2.2010  [name]) H/O DM Renal insufficiency  Past Surgical History: Reviewed history from 11/06/2009 and no changes required. Back surgery L knee surgery tonsillectomy Status post aortic valve replacement November 2010 with pericardial tissue valve.  Social History: Reviewed history from 11/20/2008 and no changes required. Tobacco Use - No.  Alcohol Use - no Retired Company secretary  Review of Systems       Some arthralgias but no fevers or chills, productive cough, hemoptysis, dysphasia, odynophagia, melena, hematochezia, dysuria, hematuria, rash, seizure activity, orthopnea, PND, pedal edema, claudication. Remaining systems are negative.   Vital Signs:  Patient profile:   77 year old male Weight:      234 pounds Pulse rate:   59 / minute Pulse rhythm:   regular BP sitting:   151 / 73  (left arm) Cuff size:   large  Vitals Entered By: Deliah Goody, RN (March 04, 2010 8:38 AM)  Physical Exam  General:  Well-developed well-nourished in no acute distress.  Skin is warm and dry.  HEENT is normal.  Neck is supple. No thyromegaly.  Chest is clear to auscultation with normal expansion.  Cardiovascular exam is regular rate and rhythm. 2/6 systolic murmur left sternal border. No diastolic murmur. Abdominal exam nontender or distended. No masses palpated. Extremities show no edema. neuro grossly intact    EKG  Procedure date:  03/04/2010  Findings:      Sinus rhythm at a rate of 59. Prior septal infarct. IVCD.  Impression & Recommendations:  Problem # 1:  ATRIAL FIBRILLATION (ICD-427.31) Patient remains in sinus rhythm status post cardioversion. I think this was most likely postoperative atrial fibrillation. It has been over 4 weeks  since his cardioversion. Discontinue Coumadin. Continue aspirin. The following medications were removed from the medication list:    Coumadin 5 Mg Tabs (Warfarin sodium) .Marland Kitchen... Take as directed His updated medication list for this problem includes:     Aspirin 81 Mg Tbec (Aspirin) .Marland Kitchen... Take one tablet by mouth daily    Metoprolol Tartrate 25 Mg Tabs (Metoprolol tartrate) .Marland Kitchen... Ran out  Problem # 2:  AORTIC VALVE REPLACEMENT, HX OF (ICD-V43.3) Continued SBE prophylaxis.  Problem # 3:  RENAL INSUFFICIENCY (ICD-588.9) Check bmet.  Problem # 4:  DM (ICD-250.00)  His updated medication list for this problem includes:    Aspirin 81 Mg Tbec (Aspirin) .Marland Kitchen... Take one tablet by mouth daily  His updated medication list for this problem includes:    Aspirin 81 Mg Tbec (Aspirin) .Marland Kitchen... Take one tablet by mouth daily  Problem # 5:  HYPERCHOLESTEROLEMIA, PURE (ICD-272.0)  Check lipids and liver. His updated medication list for this problem includes:    Simvastatin 20 Mg Tabs (Simvastatin) .Marland Kitchen... Take one tablet by mouth daily at bedtime  His updated medication list for this problem includes:    Simvastatin 20 Mg Tabs (Simvastatin) .Marland Kitchen... Take one tablet by mouth daily at bedtime  Orders: TLB-Lipid Panel (80061-LIPID) TLB-Hepatic/Liver Function Pnl (80076-HEPATIC)  Problem # 6:  HYPERTENSION, BENIGN (ICD-401.1) Blood pressure elevated. Add amlodipine 5 mg p.o. daily. His updated medication list for this problem includes:    Aspirin 81 Mg Tbec (Aspirin) .Marland Kitchen... Take one tablet by mouth daily    Metoprolol Tartrate 25 Mg Tabs (Metoprolol tartrate) .Marland Kitchen... Ran out    Amlodipine Besylate 5 Mg Tabs (Amlodipine besylate) .Marland Kitchen... Take one tablet by mouth daily  His updated medication list for this problem includes:    Aspirin 81 Mg Tbec (Aspirin) .Marland Kitchen... Take one tablet by mouth daily    Metoprolol Tartrate 25 Mg Tabs (Metoprolol tartrate) .Marland Kitchen... Ran out    Amlodipine Besylate 5 Mg Tabs (Amlodipine besylate) .Marland Kitchen... Take one tablet by mouth daily  Other Orders: TLB-BMP (Basic Metabolic Panel-BMET) (80048-METABOL)  Patient Instructions: 1)  Your physician recommends that you schedule a follow-up appointment in: 6 MONTHS 2)  Your physician has recommended  you make the following change in your medication: STOP COUMADIN 3)  START AMLODIPINE 5MG  ONCE DAILY Prescriptions: AMLODIPINE BESYLATE 5 MG TABS (AMLODIPINE BESYLATE) Take one tablet by mouth daily  #30 x 12   Entered by:   Deliah Goody, RN   Authorized by:   Ferman Hamming, MD, [facility]   Signed by:   Deliah Goody, RN on 03/04/2010   Method used:   Electronically to        [facility]  [address] [id number]       [address]       [place], [place]  [address]       Ph: [phone]       Fax: [phone]   RxID:   [id number]

## 2010-12-17 NOTE — Medication Information (Signed)
Summary: rov.mp  Anticoagulant Therapy  Managed by: Cloyde Reams, RN, BSN Referring MD: Dr Olga Millers PCP: Dr Joretta Bachelor MD: Tenny Craw MD, Gunnar Fusi Indication 1: Atrial Fibrillation Indication 2: aortic valve replacement Valve Type: Carpentier-Edwards Valve Position: Aortic Lab Used: [facility]  Site: [address] INR POC 3.4 INR RANGE 2.0-3.0  Dietary changes: no    Health status changes: no    Bleeding/hemorrhagic complications: no    Recent/future hospitalizations: no    Any changes in medication regimen? no    Recent/future dental: no  Any missed doses?: no       Is patient compliant with meds? yes       Allergies (verified): No Known Drug Allergies  Anticoagulation Management History:      The patient is taking warfarin and comes in today for a routine follow up visit.  Positive risk factors for bleeding include an age of 75 years or older and presence of serious comorbidities.  The bleeding index is 'intermediate risk'.  Positive CHADS2 values include History of HTN, Age > 57 years old, and History of Diabetes.  His last INR was 1.0 ratio.  Anticoagulation responsible provider: Tenny Craw MD, Gunnar Fusi.  INR POC: 3.4.  Cuvette Lot#: [id number].  Exp: 03/2011.    Anticoagulation Management Assessment/Plan:      The patient's current anticoagulation dose is Coumadin 5 mg tabs: take as directed.  The target INR is 2.0-3.0.  The next INR is due 03/02/2010.  Results were reviewed/authorized by Cloyde Reams, RN, BSN.  He was notified by Cloyde Reams RN.         Prior Anticoagulation Instructions: INR 2.4  Take 2 tabs TODAY ONLY, then resume 1.5 tabs each Monday and 1 tab on all other days.  Recheck in 3 weeks.  Good luck tomorrow!  Current Anticoagulation Instructions: INR 3.4  Skip today's dose of coumadin, then resume same dosage 1 tablet daily except 1.5 tablets on Mondays.  Recheck in 3 weeks.

## 2010-12-17 NOTE — Medication Information (Signed)
Summary: rov/ewj  Anticoagulant Therapy  Managed by: Weston Brass, PharmD Referring MD: Dr Olga Millers PCP: Dr Joretta Bachelor MD: Shirlee Latch MD, Freida Busman Indication 1: Atrial Fibrillation Indication 2: aortic valve replacement Valve Type: Carpentier-Edwards Valve Position: Aortic Lab Used: [facility] [place] Site: [address] INR POC 3.8 INR RANGE 2.0-3.0  Dietary changes: no    Health status changes: no    Bleeding/hemorrhagic complications: no    Recent/future hospitalizations: no    Any changes in medication regimen? no    Recent/future dental: no  Any missed doses?: no       Is patient compliant with meds? yes       Allergies: No Known Drug Allergies  Anticoagulation Management History:      The patient is taking warfarin and comes in today for a routine follow up visit.  Positive risk factors for bleeding include an age of 72 years or older and presence of serious comorbidities.  The bleeding index is 'intermediate risk'.  Positive CHADS2 values include History of HTN, Age > 63 years old, and History of Diabetes.  His last INR was 1.0 ratio.  Anticoagulation responsible provider: Shirlee Latch MD, Kolter Reaver.  INR POC: 3.8.  Cuvette Lot#: [id number].  Exp: 03/2011.    Anticoagulation Management Assessment/Plan:      The patient's current anticoagulation dose is Coumadin 5 mg tabs: take as directed.  The target INR is 2.0-3.0.  The next INR is due 03/13/2010.  Anticoagulation instructions were given to patient.  Results were reviewed/authorized by Weston Brass, PharmD.  He was notified by Weston Brass PharmD.         Prior Anticoagulation Instructions: INR 3.4  Skip today's dose of coumadin, then resume same dosage 1 tablet daily except 1.5 tablets on Mondays.  Recheck in 3 weeks.    Current Anticoagulation Instructions: INR 3.8  Skip today's dose of Coumadin then decrease dose to 1 tablet every day

## 2010-12-17 NOTE — Letter (Signed)
Summary: Cardioversion/TEE Instructions  Architectural technologist, Main Office  [address]   [place], [place] [address]   Phone: [phone]  Fax: [phone]    Cardioversion / TEE Cardioversion Instructions  You are scheduled for a Cardioversion on TUESDAY 01-20-10 with Dr. Eden Emms.   Please arrive at the [facility] at _8:30 a.m.  on the day of your procedure.  1)   DIET:  A)   Nothing to eat or drink after midnight except your medications with a sip of water.  B)   May have clear liquid breakfast, then nothing to eat or drink after _________ a.m. / p.m.      Clear liquids include:  water, broth, Sprite, Ginger Ale, black coffee, tea (no sugar),      cranberry / grape / apple juice, jello (not red), popsicle from clear juices (not red).  2)   Come to the [place] office on ____________________ for lab work. The lab at [facility] is open from 8:30 a.m. to 1:30 p.m. and 2:30 p.m. to 5:00 p.m. The lab at [facility] is open from 7:30 a.m. to 5:30 p.m. You do not have to be fasting.  3)   MAKE SURE YOU TAKE YOUR COUMADIN.  4)   A)   DO NOT TAKE these medications before your procedure:      ___________________________________________________________________     ___________________________________________________________________     ___________________________________________________________________  B)   YOU MAY TAKE ALL of your remaining medications with a small amount of water.    C)   START NEW medications:       ___________________________________________________________________     ___________________________________________________________________  5)  Must have a responsible person to drive you home.  6)   Bring a current list of your medications and current insurance cards.   * Special Note:  Every effort is made to have your procedure done on time. Occasionally there are emergencies that present themselves at  the hospital that may cause delays. Please be patient if a delay does occur.  * If you have any questions after you get home, please call the office at [phone].

## 2010-12-17 NOTE — Miscellaneous (Signed)
  Clinical Lists Changes  Observations: Added new observation of RESULTS MISC:                       CONSULTATION      An 77 year old patient of Dr. Jens Som, status post aortic valve   replacement in November 2010, with postop atrial fibrillation.  He has   been therapeutically anticoagulated in our Coumadin Clinic for the last   4 weeks, INR yesterday was 2.4.      The patient was anesthetized with 100 mg of propofol.      Two Biphasic synchronize 200-joule shocks were delivered.  The patient   converted to normal sinus rhythm at a rate of 62.      IMPRESSION:  Successful cardioversion.  The patient will follow up with   Dr. Jens Som and myself next week.  He will be followed in the Coumadin   Clinic to make sure his INR remains therapeutic.  He appears to have   tolerated the procedure well.               Noralyn Pick. Eden Emms, MD, [facility]            PCN/MEDQ  D:  01/20/2010  T:  01/21/2010  Job:  [id number]  (01/21/2010 15:51)      MISC. Report  Procedure date:  01/21/2010  Findings:                            CONSULTATION      An 77 year old patient of Dr. Jens Som, status post aortic valve   replacement in November 2010, with postop atrial fibrillation.  He has   been therapeutically anticoagulated in our Coumadin Clinic for the last   4 weeks, INR yesterday was 2.4.      The patient was anesthetized with 100 mg of propofol.      Two Biphasic synchronize 200-joule shocks were delivered.  The patient   converted to normal sinus rhythm at a rate of 62.      IMPRESSION:  Successful cardioversion.  The patient will follow up with   Dr. Jens Som and myself next week.  He will be followed in the Coumadin   Clinic to make sure his INR remains therapeutic.  He appears to have   tolerated the procedure well.               Noralyn Pick. Eden Emms, MD, [facility]            PCN/MEDQ  D:  01/20/2010  T:  01/21/2010  Job:  [id number]

## 2010-12-17 NOTE — Letter (Signed)
Summary: Handout Printed  Printed Handout:  - Coumadin Instructions-w/out Meds 

## 2010-12-17 NOTE — Medication Information (Signed)
Summary: rov/ez  Anticoagulant Therapy  Managed by: Cloyde Reams, RN, BSN Referring MD: Dr Olga Millers PCP: Dr Joretta Bachelor MD: Riley Kill MD, Maisie Fus Indication 1: Atrial Fibrillation Indication 2: aortic valve replacement Valve Type: Carpentier-Edwards Valve Position: Aortic Lab Used: [facility] Care [place] Site: [address] INR POC 3.6 INR RANGE 2.0-3.0  Dietary changes: no    Health status changes: no    Bleeding/hemorrhagic complications: no    Recent/future hospitalizations: no    Any changes in medication regimen? no    Recent/future dental: no  Any missed doses?: no       Is patient compliant with meds? yes       Allergies (verified): No Known Drug Allergies  Anticoagulation Management History:      The patient is taking warfarin and comes in today for a routine follow up visit.  Positive risk factors for bleeding include an age of 75 years or older and presence of serious comorbidities.  The bleeding index is 'intermediate risk'.  Positive CHADS2 values include History of HTN, Age > 75 years old, and History of Diabetes.  His last INR was 1.0 ratio.  Anticoagulation responsible provider: Riley Kill MD, Maisie Fus.  INR POC: 3.6.  Cuvette Lot#: [id number].  Exp: 02/2011.    Anticoagulation Management Assessment/Plan:      The patient's current anticoagulation dose is Coumadin 5 mg tabs: take as directed.  The target INR is 2.0-3.0.  The next INR is due 01/06/2010.  Results were reviewed/authorized by Cloyde Reams, RN, BSN.  He was notified by Cloyde Reams RN.         Prior Anticoagulation Instructions: INR 3.6  Skip today's dose then change dose to 1 tablet daily except 1.5 tablets on Mondays. Recheck in 2 weeks.   Current Anticoagulation Instructions: INR 3.6  Skip today's dose of coumadin, then start taking 1 tablet daily.  Recheck in 2 weeks.

## 2010-12-17 NOTE — Miscellaneous (Signed)
Summary: [facility] Cardiac Progress Report  [facility] Progress [facility]   Imported By: Roderic Ovens 12/08/2009 13:42:46  _____________________________________________________________________  External Attachment:    Type:   Image     Comment:   External Document

## 2010-12-17 NOTE — Medication Information (Signed)
Summary: rov/ewj  Anticoagulant Therapy  Managed by: Eda Keys, PharmD Referring MD: Dr Olga Millers PCP: Dr Joretta Bachelor MD: Jens Som MD, Arlys John Indication 1: Atrial Fibrillation Indication 2: aortic valve replacement Valve Type: Carpentier-Edwards Valve Position: Aortic Lab Used: [facility] [place] Site: [address] INR POC 2.9 INR RANGE 2.0-3.0  Dietary changes: no    Health status changes: no    Bleeding/hemorrhagic complications: no    Recent/future hospitalizations: no    Any changes in medication regimen? no    Recent/future dental: no  Any missed doses?: no       Is patient compliant with meds? yes       Allergies: No Known Drug Allergies  Anticoagulation Management History:      The patient is taking warfarin and comes in today for a routine follow up visit.  Positive risk factors for bleeding include an age of 75 years or older and presence of serious comorbidities.  The bleeding index is 'intermediate risk'.  Positive CHADS2 values include History of HTN, Age > 75 years old, and History of Diabetes.  His last INR was 1.0 ratio.  Anticoagulation responsible provider: Jens Som MD, Arlys John.  INR POC: 2.9.  Cuvette Lot#: [id number].  Exp: 02/2011.    Anticoagulation Management Assessment/Plan:      The patient's current anticoagulation dose is Coumadin 5 mg tabs: take as directed.  The target INR is 2.0-3.0.  The next INR is due 01/27/2010.  Results were reviewed/authorized by Eda Keys, PharmD.  He was notified by Eda Keys.         Prior Anticoagulation Instructions: INR 3.6  Skip today's dose of coumadin, then start taking 1 tablet daily.  Recheck in 2 weeks.    Current Anticoagulation Instructions: INR 2.9  Continue current dosing schedule.  Take 1.5 tablets on Monday and take 1 tablet all other days.  Return to clinic in 3 weeks.

## 2010-12-17 NOTE — Letter (Signed)
Summary: [facility] Office Note  [facility] Office Note   Imported By: Kassie Mends 12/03/2009 10:50:26  _____________________________________________________________________  External Attachment:    Type:   Image     Comment:   External Document

## 2011-02-08 LAB — PROTIME-INR
INR: 2.4 — ABNORMAL HIGH (ref 0.00–1.49)
Prothrombin Time: 26 seconds — ABNORMAL HIGH (ref 11.6–15.2)

## 2011-02-08 LAB — GLUCOSE, CAPILLARY: Glucose-Capillary: 153 mg/dL — ABNORMAL HIGH (ref 70–99)

## 2011-02-08 LAB — APTT: aPTT: 32 seconds (ref 24–37)

## 2011-02-17 LAB — CBC
HCT: 27 % — ABNORMAL LOW (ref 39.0–52.0)
HCT: 29.8 % — ABNORMAL LOW (ref 39.0–52.0)
HCT: 30.1 % — ABNORMAL LOW (ref 39.0–52.0)
HCT: 30.6 % — ABNORMAL LOW (ref 39.0–52.0)
HCT: 38 % — ABNORMAL LOW (ref 39.0–52.0)
Hemoglobin: 10.1 g/dL — ABNORMAL LOW (ref 13.0–17.0)
Hemoglobin: 10.8 g/dL — ABNORMAL LOW (ref 13.0–17.0)
Hemoglobin: 13.2 g/dL (ref 13.0–17.0)
Hemoglobin: 9.4 g/dL — ABNORMAL LOW (ref 13.0–17.0)
MCHC: 34 g/dL (ref 30.0–36.0)
MCHC: 34.7 g/dL (ref 30.0–36.0)
MCV: 90.1 fL (ref 78.0–100.0)
MCV: 90.4 fL (ref 78.0–100.0)
MCV: 90.5 fL (ref 78.0–100.0)
MCV: 91.2 fL (ref 78.0–100.0)
Platelets: 104 10*3/uL — ABNORMAL LOW (ref 150–400)
Platelets: 106 10*3/uL — ABNORMAL LOW (ref 150–400)
Platelets: 164 10*3/uL (ref 150–400)
Platelets: 96 10*3/uL — ABNORMAL LOW (ref 150–400)
RBC: 3 MIL/uL — ABNORMAL LOW (ref 4.22–5.81)
RBC: 3.33 MIL/uL — ABNORMAL LOW (ref 4.22–5.81)
RBC: 3.44 MIL/uL — ABNORMAL LOW (ref 4.22–5.81)
RBC: 3.56 MIL/uL — ABNORMAL LOW (ref 4.22–5.81)
RBC: 4.22 MIL/uL (ref 4.22–5.81)
RDW: 13.5 % (ref 11.5–15.5)
RDW: 13.7 % (ref 11.5–15.5)
RDW: 13.8 % (ref 11.5–15.5)
WBC: 11.9 10*3/uL — ABNORMAL HIGH (ref 4.0–10.5)
WBC: 12 10*3/uL — ABNORMAL HIGH (ref 4.0–10.5)
WBC: 12.9 10*3/uL — ABNORMAL HIGH (ref 4.0–10.5)
WBC: 13.3 10*3/uL — ABNORMAL HIGH (ref 4.0–10.5)
WBC: 7.5 10*3/uL (ref 4.0–10.5)
WBC: 8 10*3/uL (ref 4.0–10.5)

## 2011-02-17 LAB — MAGNESIUM: Magnesium: 2.9 mg/dL — ABNORMAL HIGH (ref 1.5–2.5)

## 2011-02-17 LAB — BLOOD GAS, ARTERIAL
Acid-base deficit: 4.2 mmol/L — ABNORMAL HIGH (ref 0.0–2.0)
Bicarbonate: 19.5 mEq/L — ABNORMAL LOW (ref 20.0–24.0)
FIO2: 0.21 %
O2 Saturation: 98.7 %
Patient temperature: 98.6
TCO2: 20.4 mmol/L (ref 0–100)
pCO2 arterial: 30.8 mmHg — ABNORMAL LOW (ref 35.0–45.0)
pH, Arterial: 7.418 (ref 7.350–7.450)
pO2, Arterial: 104 mmHg — ABNORMAL HIGH (ref 80.0–100.0)

## 2011-02-17 LAB — POCT I-STAT 4, (NA,K, GLUC, HGB,HCT)
Glucose, Bld: 116 mg/dL — ABNORMAL HIGH (ref 70–99)
Glucose, Bld: 140 mg/dL — ABNORMAL HIGH (ref 70–99)
HCT: 26 % — ABNORMAL LOW (ref 39.0–52.0)
HCT: 33 % — ABNORMAL LOW (ref 39.0–52.0)
Hemoglobin: 11.2 g/dL — ABNORMAL LOW (ref 13.0–17.0)
Hemoglobin: 8.8 g/dL — ABNORMAL LOW (ref 13.0–17.0)
Potassium: 4.3 mEq/L (ref 3.5–5.1)
Potassium: 4.3 mEq/L (ref 3.5–5.1)
Potassium: 4.4 mEq/L (ref 3.5–5.1)
Potassium: 4.7 mEq/L (ref 3.5–5.1)
Sodium: 132 mEq/L — ABNORMAL LOW (ref 135–145)
Sodium: 133 mEq/L — ABNORMAL LOW (ref 135–145)
Sodium: 134 mEq/L — ABNORMAL LOW (ref 135–145)
Sodium: 137 mEq/L (ref 135–145)
Sodium: 138 mEq/L (ref 135–145)

## 2011-02-17 LAB — BASIC METABOLIC PANEL
BUN: 17 mg/dL (ref 6–23)
BUN: 22 mg/dL (ref 6–23)
BUN: 28 mg/dL — ABNORMAL HIGH (ref 6–23)
CO2: 19 mEq/L (ref 19–32)
CO2: 25 mEq/L (ref 19–32)
Calcium: 8.9 mg/dL (ref 8.4–10.5)
Chloride: 102 mEq/L (ref 96–112)
Chloride: 105 mEq/L (ref 96–112)
Chloride: 108 mEq/L (ref 96–112)
Chloride: 108 mEq/L (ref 96–112)
Creatinine, Ser: 2.3 mg/dL — ABNORMAL HIGH (ref 0.4–1.5)
GFR calc Af Amer: 33 mL/min — ABNORMAL LOW (ref >60)
GFR calc Af Amer: 38 mL/min — ABNORMAL LOW (ref >60)
GFR calc Af Amer: 41 mL/min — ABNORMAL LOW (ref >60)
GFR calc Af Amer: 53 mL/min — ABNORMAL LOW (ref >60)
GFR calc non Af Amer: 27 mL/min — ABNORMAL LOW (ref >60)
GFR calc non Af Amer: 31 mL/min — ABNORMAL LOW (ref >60)
GFR calc non Af Amer: 32 mL/min — ABNORMAL LOW (ref >60)
GFR calc non Af Amer: 34 mL/min — ABNORMAL LOW (ref >60)
Glucose, Bld: 125 mg/dL — ABNORMAL HIGH (ref 70–99)
Glucose, Bld: 153 mg/dL — ABNORMAL HIGH (ref 70–99)
Glucose, Bld: 157 mg/dL — ABNORMAL HIGH (ref 70–99)
Potassium: 4.1 mEq/L (ref 3.5–5.1)
Potassium: 4.1 mEq/L (ref 3.5–5.1)
Potassium: 4.2 mEq/L (ref 3.5–5.1)
Potassium: 4.2 mEq/L (ref 3.5–5.1)
Potassium: 4.5 mEq/L (ref 3.5–5.1)
Sodium: 138 mEq/L (ref 135–145)
Sodium: 139 mEq/L (ref 135–145)

## 2011-02-17 LAB — POCT I-STAT, CHEM 8
Calcium, Ion: 1.2 mmol/L (ref 1.12–1.32)
Chloride: 109 mEq/L (ref 96–112)
HCT: 32 % — ABNORMAL LOW (ref 39.0–52.0)
Hemoglobin: 10.9 g/dL — ABNORMAL LOW (ref 13.0–17.0)
TCO2: 19 mmol/L (ref 0–100)

## 2011-02-17 LAB — POCT I-STAT 3, ART BLOOD GAS (G3+)
Acid-base deficit: 2 mmol/L (ref 0.0–2.0)
Acid-base deficit: 6 mmol/L — ABNORMAL HIGH (ref 0.0–2.0)
Bicarbonate: 19.5 mEq/L — ABNORMAL LOW (ref 20.0–24.0)
Bicarbonate: 19.7 mEq/L — ABNORMAL LOW (ref 20.0–24.0)
Bicarbonate: 25.1 mEq/L — ABNORMAL HIGH (ref 20.0–24.0)
O2 Saturation: 100 %
O2 Saturation: 100 %
O2 Saturation: 96 %
Patient temperature: 35.5
Patient temperature: 36.6
TCO2: 21 mmol/L (ref 0–100)
TCO2: 26 mmol/L (ref 0–100)
pCO2 arterial: 39.9 mmHg (ref 35.0–45.0)
pH, Arterial: 7.354 (ref 7.350–7.450)
pH, Arterial: 7.396 (ref 7.350–7.450)
pO2, Arterial: 153 mmHg — ABNORMAL HIGH (ref 80.0–100.0)

## 2011-02-17 LAB — COMPREHENSIVE METABOLIC PANEL
ALT: 13 U/L (ref 0–53)
Alkaline Phosphatase: 78 U/L (ref 39–117)
BUN: 20 mg/dL (ref 6–23)
CO2: 19 mEq/L (ref 19–32)
Calcium: 9.5 mg/dL (ref 8.4–10.5)
GFR calc non Af Amer: 41 mL/min — ABNORMAL LOW (ref >60)
Glucose, Bld: 112 mg/dL — ABNORMAL HIGH (ref 70–99)
Potassium: 4.9 mEq/L (ref 3.5–5.1)
Sodium: 137 mEq/L (ref 135–145)
Total Protein: 6.4 g/dL (ref 6.0–8.3)

## 2011-02-17 LAB — URINALYSIS, ROUTINE W REFLEX MICROSCOPIC
Nitrite: NEGATIVE
Specific Gravity, Urine: 1.014 (ref 1.005–1.030)
Urobilinogen, UA: 0.2 mg/dL (ref 0.0–1.0)
pH: 6.5 (ref 5.0–8.0)

## 2011-02-17 LAB — GLUCOSE, CAPILLARY
Glucose-Capillary: 132 mg/dL — ABNORMAL HIGH (ref 70–99)
Glucose-Capillary: 132 mg/dL — ABNORMAL HIGH (ref 70–99)
Glucose-Capillary: 144 mg/dL — ABNORMAL HIGH (ref 70–99)
Glucose-Capillary: 148 mg/dL — ABNORMAL HIGH (ref 70–99)
Glucose-Capillary: 152 mg/dL — ABNORMAL HIGH (ref 70–99)
Glucose-Capillary: 157 mg/dL — ABNORMAL HIGH (ref 70–99)
Glucose-Capillary: 157 mg/dL — ABNORMAL HIGH (ref 70–99)
Glucose-Capillary: 160 mg/dL — ABNORMAL HIGH (ref 70–99)
Glucose-Capillary: 161 mg/dL — ABNORMAL HIGH (ref 70–99)
Glucose-Capillary: 240 mg/dL — ABNORMAL HIGH (ref 70–99)

## 2011-02-17 LAB — HEMOGLOBIN AND HEMATOCRIT, BLOOD
HCT: 26.8 % — ABNORMAL LOW (ref 39.0–52.0)
Hemoglobin: 9.3 g/dL — ABNORMAL LOW (ref 13.0–17.0)

## 2011-02-17 LAB — HEMOGLOBIN A1C
Hgb A1c MFr Bld: 5.9 % (ref 4.6–6.1)
Mean Plasma Glucose: 123 mg/dL

## 2011-02-17 LAB — PROTIME-INR
INR: 1.4 (ref 0.00–1.49)
Prothrombin Time: 17 seconds — ABNORMAL HIGH (ref 11.6–15.2)

## 2011-02-17 LAB — ABO/RH: ABO/RH(D): O POS

## 2011-02-17 LAB — CREATININE, SERUM: GFR calc non Af Amer: 41 mL/min — ABNORMAL LOW (ref >60)

## 2011-02-17 LAB — POCT I-STAT 3, VENOUS BLOOD GAS (G3P V)
TCO2: 24 mmol/L (ref 0–100)
pH, Ven: 7.335 — ABNORMAL HIGH (ref 7.250–7.300)

## 2011-02-17 LAB — TYPE AND SCREEN

## 2011-02-19 LAB — POCT I-STAT 3, ART BLOOD GAS (G3+)
Acid-base deficit: 3 mmol/L — ABNORMAL HIGH (ref 0.0–2.0)
Bicarbonate: 21.4 mEq/L (ref 20.0–24.0)
TCO2: 22 mmol/L (ref 0–100)
pO2, Arterial: 87 mmHg (ref 80.0–100.0)

## 2011-02-19 LAB — POCT I-STAT 3, VENOUS BLOOD GAS (G3P V)
Acid-base deficit: 4 mmol/L — ABNORMAL HIGH (ref 0.0–2.0)
Bicarbonate: 21.3 mEq/L (ref 20.0–24.0)
TCO2: 22 mmol/L (ref 0–100)
pO2, Ven: 36 mmHg (ref 30.0–45.0)

## 2011-02-19 LAB — POCT I-STAT GLUCOSE: Glucose, Bld: 128 mg/dL — ABNORMAL HIGH (ref 70–99)

## 2011-03-30 NOTE — Assessment & Plan Note (Signed)
OFFICE VISIT   KILIAN, SCHWARTZ  DOB:  1923/12/18                                        November 24, 2009  CHART #:  [id number]   HISTORY:  The patient returns to the office today for further followup  of status post aortic valve replacement on September 16, 2009.  He had  done quite well postoperatively, although he did develop postoperative  atrial fibrillation and he has remained in persistent atrial  fibrillation for several weeks.  He is now taking Coumadin.  He has not  had any problems with Coumadin therapy.  Overall, he feels quite well.  He is getting around well and he reports no problems or complaints.  He  has had minimal soreness in his chest ever since his surgery.  He has  not had any shortness of breath.  He has not had any tachy palpitations  or dizzy spells.  The remainder of his review of systems is entirely  unremarkable.   PHYSICAL EXAMINATION:  General:  A well-appearing male.  Vital Signs:  Blood pressure 119/75, pulse is 88 and irregular, oxygen saturation 99%  on room air.  Chest:  Well-healed median sternotomy incision.  The  sternum is stable on palpation.  Breath sounds are clear to  auscultation.  Cardiovascular:  Irregular heart rhythm.  No murmurs,  rubs, or gallops are noted.  Abdomen:  Soft, nontender.  Extremities:  Warm and well perfused.  There is no lower extremity edema.   IMPRESSION:  The patient has done very well postoperatively with  exception of the fact that he has developed persistent atrial  fibrillation.  He is now stable on Coumadin therapy.   PLAN:  I have encouraged the patient to continue to increase his  physical activity as tolerated.  I agree with tentative plan to consider  DC cardioversion after 6 weeks of Coumadin.  All of his  questions have been addressed.  In the future, the patient will call or  return to see [place] here at [facility] only should  further problems or  difficulties arise.   Salvatore Decent. Cornelius Moras, M.D.  Electronically Signed   CHO/MEDQ  D:  11/24/2009  T:  11/25/2009  Job:  [id number]   cc:   Madolyn Frieze. Jens Som, MD, [facility]  Gwen Pounds, MD

## 2011-03-30 NOTE — Assessment & Plan Note (Signed)
OFFICE VISIT   [name], [name]  DOB:  May 23, 1924                                        October 13, 2009  CHART #:  [id number]   HISTORY:  The patient returns for routine followup status post aortic  valve replacement on September 16, 2009.  His early postoperative recovery  while he remained in the hospital was entirely uncomplicated.  Following  hospital discharge, he has continued to do well clinically.  He was seen  at Dr. Ludwig Clarks office by one of the physician assistants last week.  Apparently at that time, he was noted to be in atrial fibrillation and  he was given a prescription for Coumadin.  He apparently just took 1  dose of the Coumadin that afternoon.  He states that he felt poorly that  night, so he thought that it was probably the Coumadin and stopped  taking them at that time.  He is not taking any Coumadin since.  He  otherwise continues to feel quite well.  He reports minimal soreness in  his chest and he has not required any pain relievers or whatsoever.  He  has not had any shortness of breath.  He has not had any tachy  palpitations or dizzy spells.  His appetite is good.  He is sleeping  well at night.  He states that he is walking 30-45 minutes at a time  without any difficulty.  Overall, he has absolutely no complaints.   MEDICATIONS AT THIS TIME:  1. Aspirin 81 mg daily.  2. Metoprolol 25 mg twice daily.  3. Simvastatin 20 mg daily.   PHYSICAL EXAMINATION:  Notable for a well-appearing male with blood  pressure 120/77, pulse is 100, and two-channel telemetry rhythm strip  demonstrates atrial fibrillation with a remarkably controlled  ventricular response rate.  Oxygen saturation is 100% on room air.  Examination of the chest demonstrates a median sternotomy incision that  is healing quite nicely.  The sternum is stable on palpation.  Breath  sounds are clear to auscultation and symmetrical bilaterally.  No  wheezes or  rhonchi are noted.  Cardiovascular exam is notable for  irregular heart rhythm.  No murmurs, rubs, or gallops are appreciated.  The abdomen is soft and nontender.  The extremities are warm and well  perfused.  There is no lower extremity edema.  The remainder of his  physical exam is unrevealing.   DIAGNOSTICS TEST:  Chest x-ray performed today at the [facility] is reviewed.  This demonstrates clear lung fields bilaterally.  All the sternal wires appear intact.  There are no pleural effusions.  No other abnormalities are noted.   IMPRESSION:  Overall, the patient looks remarkably well.  He is in  atrial fibrillation with a controlled ventricular response rate.  He  otherwise looks remarkably good.   PLAN:  I have reemphasized the indications and potential benefits of  short-term therapy with Coumadin for the patient and his daughter here  in the office today.  He seemed a bit confused about the reason why he  was given the prescription, and hopefully we have cleared that up.  He  understands that there is a small, but significant risk of  thromboembolization and possible stroke in the setting of atrial  fibrillation, and that Coumadin would decrease his risk over  time.  I  have suggested that he go back to try and taking it again.  If he  continues to have any symptoms after Coumadin that seem to be related to  the medication itself, it is possible that he should be switched to  Plavix as a slightly less effective alternative.  All of his questions  have been addressed.  I have also instructed him that if he just cannot  tolerate Coumadin, he should contact Dr. Ludwig Clarks office as soon as  practical to alert them at this problem.  Otherwise, he will go ahead  and resume trying to take Coumadin this afternoon and go to the Coumadin  Clinic late this week to have a prothrombin time INR checked.  All of  his questions have been addressed.  At this point, I have cautioned  him  to continue to refrain from any heavy lifting or strenuous use of his  arms or shoulders for at least another 2 months.  We will plan to see  him back in 8 weeks to make sure that he continues to do well.  I think  that once his heart rhythm get straightened out, he can go back to  driving an automobile, but I have suggested that he should do so only  for short distances during the daylight hours at first.   Salvatore Decent. Cornelius Moras, M.D.  Electronically Signed   CHO/MEDQ  D:  10/13/2009  T:  10/14/2009  Job:  [id number]   cc:   Madolyn Frieze. Jens Som, MD, [facility]  Gwen Pounds, MD

## 2011-03-30 NOTE — Assessment & Plan Note (Signed)
[facility]                            CARDIOLOGY OFFICE NOTE   NAME:[name], [name]                     MRN:          [id number]  DATE:06/05/2008                            DOB:          1924-08-08    [name] is a very pleasant 77 year old gentleman who I am asked to  evaluate for aortic stenosis.  The patient does have a history of a  Myoview performed on January 19, 2007.  This showed no scar or ischemia.  The ejection fraction was 52%.  He had a recent echocardiogram performed  on April 29, 2008, that showed normal LV function.  The aortic valve  thickness was increased and there was severe aortic stenosis with a mean  gradient of 43 mmHg and a peak velocity of 4.5 meters per second.  Note,  he had mild aortic insufficiency.  There was mild aortic root  dilatation.  There was trivial mitral regurgitation.  There was mild  left atrial enlargement.  Because of the above, we were asked to further  evaluate.  Note, the patient walks on a routinely basis in the mornings  for 1 hour at a time.  He does not have dyspnea on exertion, orthopnea,  PND, pedal edema, palpitations, presyncope, syncope, or chest pain.   His medications include:  1. ICaps.  2. Benicar 20 mg p.o. daily.  3. Aspirin 81 mg p.o. daily.  4. Zocor 20 mg p.o. nightly.   He has no known drug allergies.    His family history is negative for coronary artery disease, although he  states his father died in his 65s of congestive heart failure.   SOCIAL HISTORY:  He does not smoke nor does he consume alcohol.  He is a  retired IT sales professional.   His past medical history is significant for hypertension as well as  hyperlipidemia.  There is no diabetes mellitus.  He does have a history  of back surgery and has had prior left knee surgery.  He has had prior  tonsillectomy.  He also has arthritis.  He states he has had diabetes in  the past.   REVIEW OF SYSTEMS:  He denies any headaches,  fevers, or chills.  There  is no productive cough or hemoptysis.  There is no dysphagia,  odynophagia, melena, or hematochezia.  There is no dysuria or hematuria.  No rashes or seizure activity.  There is no orthopnea, PND, or pedal  edema.  There is no claudication noted.  The remaining systems are  negative.   Physical examination today shows a blood pressure of 132/71 and his  pulse is 65.  He weighs 204 Wang.  He is well developed and well  nourished distress, in no acute distress.  His skin is warm and dry.  He  does not appear to be depressed.  There is no peripheral clubbing.  His  back is normal.  His HEENT is normal with normal eyelids.  His neck is  supple.  His carotid upstroke is diminished.  There is no jugular venous  distention and I cannot appreciate thyromegaly.  He does  have murmur  radiating to his carotids noted on exam.  His chest is clear to  auscultation with normal expansion.  His cardiovascular exam is regular  rate and rhythm.  Normal S1 and S2.  There is a 3/6 systolic murmur at  the left sternal border.  S2 is diminished.  There is no S3 or S4.  Abdominal exam is nontender and nondistended.  Positive bowel sounds.  No hepatosplenomegaly.  No mass appreciated.  There is no abdominal  bruit.  He has 2+ femoral pulses bilaterally.  No bruits.  Extremities  show no edema.  I can palpate no cords.  He has 1+ dorsalis pedis pulses  bilaterally.  Neurologic exam is grossly intact.   Electrocardiogram today shows a sinus rhythm at a rate of 71.  There is  left ventricle hypertrophy.  A prior septal infarct cannot be excluded.  There is left anterior fascicular block.   DIAGNOSES:  1. Aortic stenosis - based on his echocardiogram, [name] has      severe aortic stenosis.  However, he is not having symptoms      including no dyspnea, no chest pain, and no syncope.  We will      therefore follow him expectantly.  I will see him back in      approximately 6  months.  We will most likely repeat his      echocardiogram at that time.  Note, I have explained aortic      stenosis to the patient today and I have told him that he may      require aortic valve replacement in the future.  2. Hypertension - his blood pressure is adequately controlled today on      his present medications.  3. Hyperlipidemia - he will continue on his statin and Dr. Timothy Wang is      following this.  4. History of diabetes mellitus - managed per Dr. Timothy Wang.   We will see him back in 6 months and again we will repeat his  echocardiogram at that time.     [name] Mark Som, MD, [facility]  Electronically Signed    BSC/MedQ  DD: 06/05/2008  DT: 06/06/2008  Job #: [id number]   cc:   Mark Pounds, MD

## 2011-03-30 NOTE — H&P (Signed)
HISTORY AND PHYSICAL EXAMINATION   August 25, 2009   Re:  [name], [name]        DOB:  [birth year]   REASON FOR CONSULTATION:  Severe aortic stenosis.   HISTORY OF PRESENT ILLNESS:  The patient is an 77 year old gentleman  from [place], who was recently discovered to have a heart murmur on  routine physical exam by his primary care physician, Dr. Creola Corn.  He  was referred to Dr. Olga Millers, who first saw him more than a year  ago.  At the time, he was noted to have severe aortic stenosis with  normal left ventricular systolic function.  Initially, the patient  remained entirely asymptomatic.  He has been followed carefully by Dr.  Jens Som and followup echocardiogram performed June 10, 2009 documented  some progression of the severity of aortic stenosis.  The peak velocity  across the valve measured 537 cm/sec corresponding to peak and mean  transvalvular gradients of 115 and 57 mmHg respectively.  The aortic  valve area was estimated to be 0.78 cm2.  Left ventricular systolic  function remained normal.  There was evidence of moderate left  ventricular hypertrophy with impaired left ventricular relaxation.  There was mild aortic regurgitation.  No other significant abnormalities  were noted.  The patient subsequently underwent elective left and right  heart catheterization by Dr. Charlton Haws on August 08, 2009.  This  confirmed the presence of very severe aortic stenosis with no  significant coronary artery disease.  Pulmonary artery pressures  measured 27/6 with pulmonary capillary wedge pressure of 6, and cardiac  output of 5.2 corresponding to cardiac index of 2.3.  The patient has  been referred for possible elective aortic valve replacement.   REVIEW OF SYSTEMS:  GENERAL:  The patient reports feeling well overall.  He has normal appetite.  VITAL SIGNS:  He is 5 feet 10 inches tall and weighs approximately 235  pounds.  He is actually lost  some weight over the last couple of years  in an effort to trim down some.  CARDIAC:  The patient does admit to exertional shortness of breath.  These symptoms have progressed over the last year or so.  He denies any  resting shortness of breath, PND, orthopnea, or lower extremity edema.  He has not had any chest pain either with activity or at rest.  He  denies any tachy palpitations, dizzy spells, or syncope.  RESPIRATORY:  Negative.  The patient denies productive cough,  hemoptysis, or wheezing.  GASTROINTESTINAL:  Negative.  The patient has no difficulty swallowing.  He denies hematochezia, hematemesis, or melena.  MUSCULOSKELETAL:  Notable for some moderate arthritis and arthralgias afflicting both  knees, particularly as well as his elbows.  This does not seem to limit  him too much.  NEUROLOGIC:  Negative.  The patient denies symptoms suggestive of  previous TIA or stroke.  HEENT:  Negative.  The patient has not seen a dentist in the long time  and he admits that he just had a bridge followup and he had some dental  issues that probably need to be addressed.  He denies any tender teeth  or symptoms to suggest an ongoing infection.   PAST MEDICAL HISTORY:  1. Aortic stenosis.  2. Borderline type 2 diabetes mellitus.  3. Hyperlipidemia.  4. Arthritis.  5. Hypertension.   PAST SURGICAL HISTORY:  1. Left knee surgery.  2. Tonsillectomy.  3. Back surgery.   SOCIAL HISTORY:  The patient  is widowed and lives with his grandson.  He  is retired for the past 30 years, having previously worked for the [employer]  of [employer].  He still remains quite active physically  and completely independent functionally.  He is a nonsmoker.  He denies  significant alcohol consumption.   CURRENT MEDICATIONS:  1. Simvastatin 20 mg daily.  2. Aspirin 81 mg daily.   DRUG ALLERGIES:  None known.   PHYSICAL EXAMINATION:  General:  The patient is a well-appearing male  who appears  somewhat younger than stated age, in no acute distress.  He  is mild to moderately obese.  Vital Signs:  Blood pressure 134/61, pulse  82 and regular, and oxygen saturation 98% on room air.  HEENT:  Notable  for poor dentition.  Neck:  Supple.  There is no cervical nor  supraclavicular lymphadenopathy.  There are no carotid bruits.  Chest:  Auscultation of the chest demonstrates clear breath sounds, which are  symmetrical bilaterally.  No wheezes or rhonchi appreciated.  Cardiovascular:  Notable for grade 3-4/6 crescendo-decrescendo systolic  murmur heard best along the right sternal border with radiation towards  the neck.  No diastolic murmurs are noted.  Abdomen:  Mildly obese, but  soft and nontender.  Bowel sounds are present.  There are no palpable  masses.  Extremities: Warm and well perfused.  There is no lower  extremity edema.  Distal pulses are diminished, but palpable in the  posterior tibial and dorsalis pedis positions.  Skin:  Clean, dry, and  healthy appearing throughout.  Rectal and GU:  Both deferred.  Neurologic:  Grossly nonfocal and symmetrical throughout.   DIAGNOSTIC TEST:  A 2-D echocardiogram performed June 10, 2009 is  reviewed.  This confirms the presence of severe aortic stenosis.  There  is mild aortic regurgitation.  There is normal left ventricular systolic  function.  There is mild to moderate left ventricular hypertrophy.  The  aortic root is not significantly enlarged.  There is heavy calcification  in the aortic valve.  No other significant abnormalities are noted.   Left and right heart catheterization performed by Dr. Eden Emms are  reviewed.  This reveals the presence of normal coronary artery anatomy  with no significant coronary artery disease.  The aortic root was mildly  enlarged.  There is severe aortic stenosis.  Pulmonary artery pressures  are as noted previously.  No other abnormalities are noted.   IMPRESSION:  Severe aortic stenosis with  stable symptoms of mild  exertional shortness of breath.  I agree that the patient best be  treated with elective aortic valve replacement.  His risks of surgery  will be acceptably low, but slightly increased due to his advanced age.  He did also have mild renal insufficiency appreciated at the time of his  recent catheterization.  Nevertheless, the relative risks of surgery  should be acceptably low and I expect he will do well.   PLAN:  I have discussed the options at length with the patient and 2 of  his daughters here in the office today.  Alternative treatment  strategies have been discussed.  They understand and accept all  potential associated risks of surgery including, but not limited to risk  of death, stroke, myocardial infarction, congestive heart failure,  respiratory failure, pneumonia, bleeding requiring blood transfusion,  arrhythmia, heart block with bradycardia requiring permanent pacemaker,  late complications related to valve replacement.  Based upon his age, I  have suggested that  he might best be suited with bioprosthetic tissue  valve.  The risk of late structural valve deterioration and failure  should be very low during his lifetime.  We will also send him for  preoperative dental consultation for dental cleaning and exam prior to  surgery.  We will tentatively plan to proceed with surgery on Wednesday,  September 17, 2009.  The patient will return to see [place] here in the office  on Monday, September 15, 2009 for final consultation prior to surgery.   Salvatore Decent. Cornelius Moras, M.D.  Electronically Signed   CHO/MEDQ  D:  08/25/2009  T:  08/26/2009  Job:  [id number]   cc:   Madolyn Frieze. Jens Som, MD, [facility]  Gwen Pounds, MD  Charlynne Pander, D.D.S.

## 2011-03-30 NOTE — Assessment & Plan Note (Signed)
[facility]                            CARDIOLOGY OFFICE NOTE   NAME:[name]                     MRN:          [id number]  DATE:11/20/2008                            DOB:          1924/01/04    [name] is a pleasant gentleman who is 77 years old who has a  history of aortic stenosis.  I last saw him on June 05, 2008.  His most  recent echocardiogram performed in June 2009, showed normal LV function  with severe aortic stenosis (mean gradient of 43 mmHg, peak velocity of  4.5 meters per second).  There was also mild aortic insufficiency, and  mild aortic root dilatation.  Since I last saw him, he is doing well  symptomatically.  He continues to walk an hour each day with no symptoms  of chest pain, shortness of breath, and there is no history of  presyncope or syncope.  There is no palpitations.   MEDICATIONS:  Icaps, Benicar 20 mg p.o. daily, aspirin 81 mg p.o. daily,  Zocor 20 mg p.o. daily.   PHYSICAL EXAMINATION:  VITAL SIGNS:  Blood pressure of 141/59 and his  pulse is 58.  He weighs 243 pounds.  HEENT:  Normal.  NECK:  Supple.  CHEST:  Clear.  CARDIOVASCULAR:  Regular rate and rhythm.  There is a 3/6 systolic  murmur at the left sternal border.  S2 is diminished.  The murmur  radiates to the carotids.  Note, his carotid upstroke is mildly  diminished.  His abdominal exam shows no tenderness.  EXTREMITIES:  No  edema.   Electrocardiogram shows a sinus rhythm at a rate of 60.  There is left  axis deviation.  There is left ventricular hypertrophy.  There is a  prior septal infarct.   DIAGNOSES:  1. Aortic stenosis - [name] continues to do well from a      symptomatic standpoint with no chest pain, dyspnea, or syncope.  We      will plan to repeat his echocardiogram.  I have explained him that      he will most likely require aortic valve replacement in the future.      I have also instructed him to watch for any of the  above symptoms      as this would be an indication for aortic valve replacement.  If      his aortic valve gradient is unchanged, we will see him back in 6      months.  2. Hypertension - His blood pressure is adequately controlled on his      present medications.  3. Hyperlipidemia - He will continue on his statin and this is being      followed by Dr. Timothy Lasso.  4. History of diabetes mellitus.    Madolyn Frieze Jens Som, MD, [facility]  Electronically Signed   BSC/MedQ  DD: 11/20/2008  DT: 11/21/2008  Job #: [id number]   cc:   Gwen Pounds, MD

## 2011-03-30 NOTE — Assessment & Plan Note (Signed)
OFFICE VISIT   ERAN, MISTRY  DOB:  September 17, 1924                                        September 15, 2009  CHART #:  [id number]   The patient returns to the office today for further follow up in  consultation with tentative plans to proceed with elective aortic valve  replacement tomorrow.  He was originally seen in the office on August 25, 2009, and a full history and physical exam was dictated at that  time.  Since then, he underwent dental consultation with Dr. Cindra Eves and subsequent dental extraction by an oral surgeon locally  here in town.  He tolerated this well and has been cleared for surgery.  He otherwise feels well and is eager to proceed with surgery as  described.  I again reviewed the indications, risks, and potential  benefits of surgery with him and his daughters here in the office today.  All of their questions have been addressed.  We plan to proceed with  surgery tomorrow as previously scheduled.   Salvatore Decent. Cornelius Moras, M.D.  Electronically Signed   CHO/MEDQ  D:  09/15/2009  T:  09/16/2009  Job:  [id number]   cc:   Madolyn Frieze. Jens Som, MD, [facility]  Gwen Pounds, MD

## 2011-08-13 ENCOUNTER — Encounter: Payer: Self-pay | Admitting: Cardiology

## 2011-08-17 ENCOUNTER — Encounter: Payer: Self-pay | Admitting: Cardiology

## 2011-08-17 ENCOUNTER — Ambulatory Visit (INDEPENDENT_AMBULATORY_CARE_PROVIDER_SITE_OTHER): Payer: Medicare Other | Admitting: Cardiology

## 2011-08-17 VITALS — BP 152/80 | HR 63 | Ht 70.0 in | Wt 231.0 lb

## 2011-08-17 DIAGNOSIS — I359 Nonrheumatic aortic valve disorder, unspecified: Secondary | ICD-10-CM

## 2011-08-17 DIAGNOSIS — I10 Essential (primary) hypertension: Secondary | ICD-10-CM

## 2011-08-17 NOTE — Assessment & Plan Note (Signed)
Plan repeat echocardiogram. Patient instructed on SBE prophylaxis.

## 2011-08-17 NOTE — Patient Instructions (Signed)

## 2011-08-17 NOTE — Assessment & Plan Note (Signed)
Continue statin. Lipids and liver monitored by primary care. 

## 2011-08-17 NOTE — Progress Notes (Signed)
HPI:[name] is a pleasant gentleman  who has a history of aortic stenosis. An echocardiogram was performed on June 10, 2009. It revealed normal LV function and severe aortic stenosis with a mean gradient of 57 mm per mercury. The aortic root was mildly dilated. There was mild left atrial enlargement. We therefore felt that aortic valve replacement was indicated. He underwent cardiac catheterization on August 08, 2009. The coronaries were normal. The aortic valve was calcified. By hand injection the aortic root did not appear to be dilated. The patient also has renal insufficiency. The patient subsequently underwent aortic valve replacement with a pericardial tissue valve on September 16, 2009. Followup echocardiogram in December of 2010  revealed normal LV function and a well-functioning aortic valve with a mean gradient of 6 mmHg, mild to moderate mitral regurgitation, mild biatrial enlargement, and mild right ventricular enlargement. The patient also developed postoperative atrial fibrillation. He underwent elective cardioversion on January 20, 2010. I last saw him in Oct of 2011. Since then, the patient denies any dyspnea on exertion, orthopnea, PND, pedal edema, palpitations or chest pain.  Current Outpatient Prescriptions  Medication Sig Dispense Refill  . aspirin 81 MG tablet Take 81 mg by mouth daily.        . metoprolol tartrate (LOPRESSOR) 25 MG tablet Take 25 mg by mouth daily.       . pravastatin (PRAVACHOL) 40 MG tablet Take 40 mg by mouth daily.          Past Medical History  Diagnosis Date  . Hyperlipidemia   . Hypertension   . Arthritis   . Aortic stenosis   . Renal insufficiency   . Type 2 diabetes mellitus     borderline  . Anemia     Past Surgical History  Procedure Date  . Back surgery   . Knee surgery     Left Knee  . Tonsillectomy   . Aortic valve replacement November 2010    History   Social History  . Marital Status: Widowed    Spouse Name: N/A    Number  of Children: N/A  . Years of Education: N/A   Occupational History  . Retired Company secretary    Social History Main Topics  . Smoking status: Never Smoker   . Smokeless tobacco: Not on file  . Alcohol Use: No  . Drug Use: No  . Sexually Active: Not on file   Other Topics Concern  . Not on file   Social History Narrative  . No narrative on file    ROS: no fevers or chills, productive cough, hemoptysis, dysphasia, odynophagia, melena, hematochezia, dysuria, hematuria, rash, seizure activity, orthopnea, PND, pedal edema, claudication. Remaining systems are negative.  Physical Exam: Well-developed well-nourished in no acute distress.  Skin is warm and dry.  HEENT is normal.  Neck is supple. No thyromegaly.  Chest is clear to auscultation with normal expansion.  Cardiovascular exam is regular rate and rhythm. 2/6 systolic ejection murmur. No diastolic murmur. Abdominal exam nontender or distended. No masses palpated. Extremities show no edema. neuro grossly intact  ECG sinus rhythm at a rate of 66. First degree AV block. Cannot rule out prior septal infarct. Nonspecific ST changes.

## 2011-08-17 NOTE — Assessment & Plan Note (Signed)
Blood pressure mildly elevated. We will follow this and increase medications as needed. Potassium and renal function monitored by primary care.

## 2011-08-18 ENCOUNTER — Encounter: Payer: Self-pay | Admitting: *Deleted

## 2011-08-20 ENCOUNTER — Other Ambulatory Visit (HOSPITAL_COMMUNITY): Payer: Medicare Other

## 2011-08-24 ENCOUNTER — Ambulatory Visit (HOSPITAL_COMMUNITY): Payer: Medicare Other | Attending: Cardiology

## 2011-08-24 DIAGNOSIS — N289 Disorder of kidney and ureter, unspecified: Secondary | ICD-10-CM | POA: Insufficient documentation

## 2011-08-24 DIAGNOSIS — I10 Essential (primary) hypertension: Secondary | ICD-10-CM | POA: Insufficient documentation

## 2011-08-24 DIAGNOSIS — E669 Obesity, unspecified: Secondary | ICD-10-CM | POA: Insufficient documentation

## 2011-08-24 DIAGNOSIS — I359 Nonrheumatic aortic valve disorder, unspecified: Secondary | ICD-10-CM

## 2011-08-24 DIAGNOSIS — E7800 Pure hypercholesterolemia, unspecified: Secondary | ICD-10-CM | POA: Insufficient documentation

## 2011-08-24 DIAGNOSIS — I4891 Unspecified atrial fibrillation: Secondary | ICD-10-CM | POA: Insufficient documentation

## 2011-09-13 IMAGING — CR DG CHEST 2V
2 series · 2 of 2 positions shown · non-contrast
Comparison: None

CLINICAL DATA: History given of acute rheumatic endocarditis.

CHEST - 2 VIEW

[view not recorded (1 of 2)]
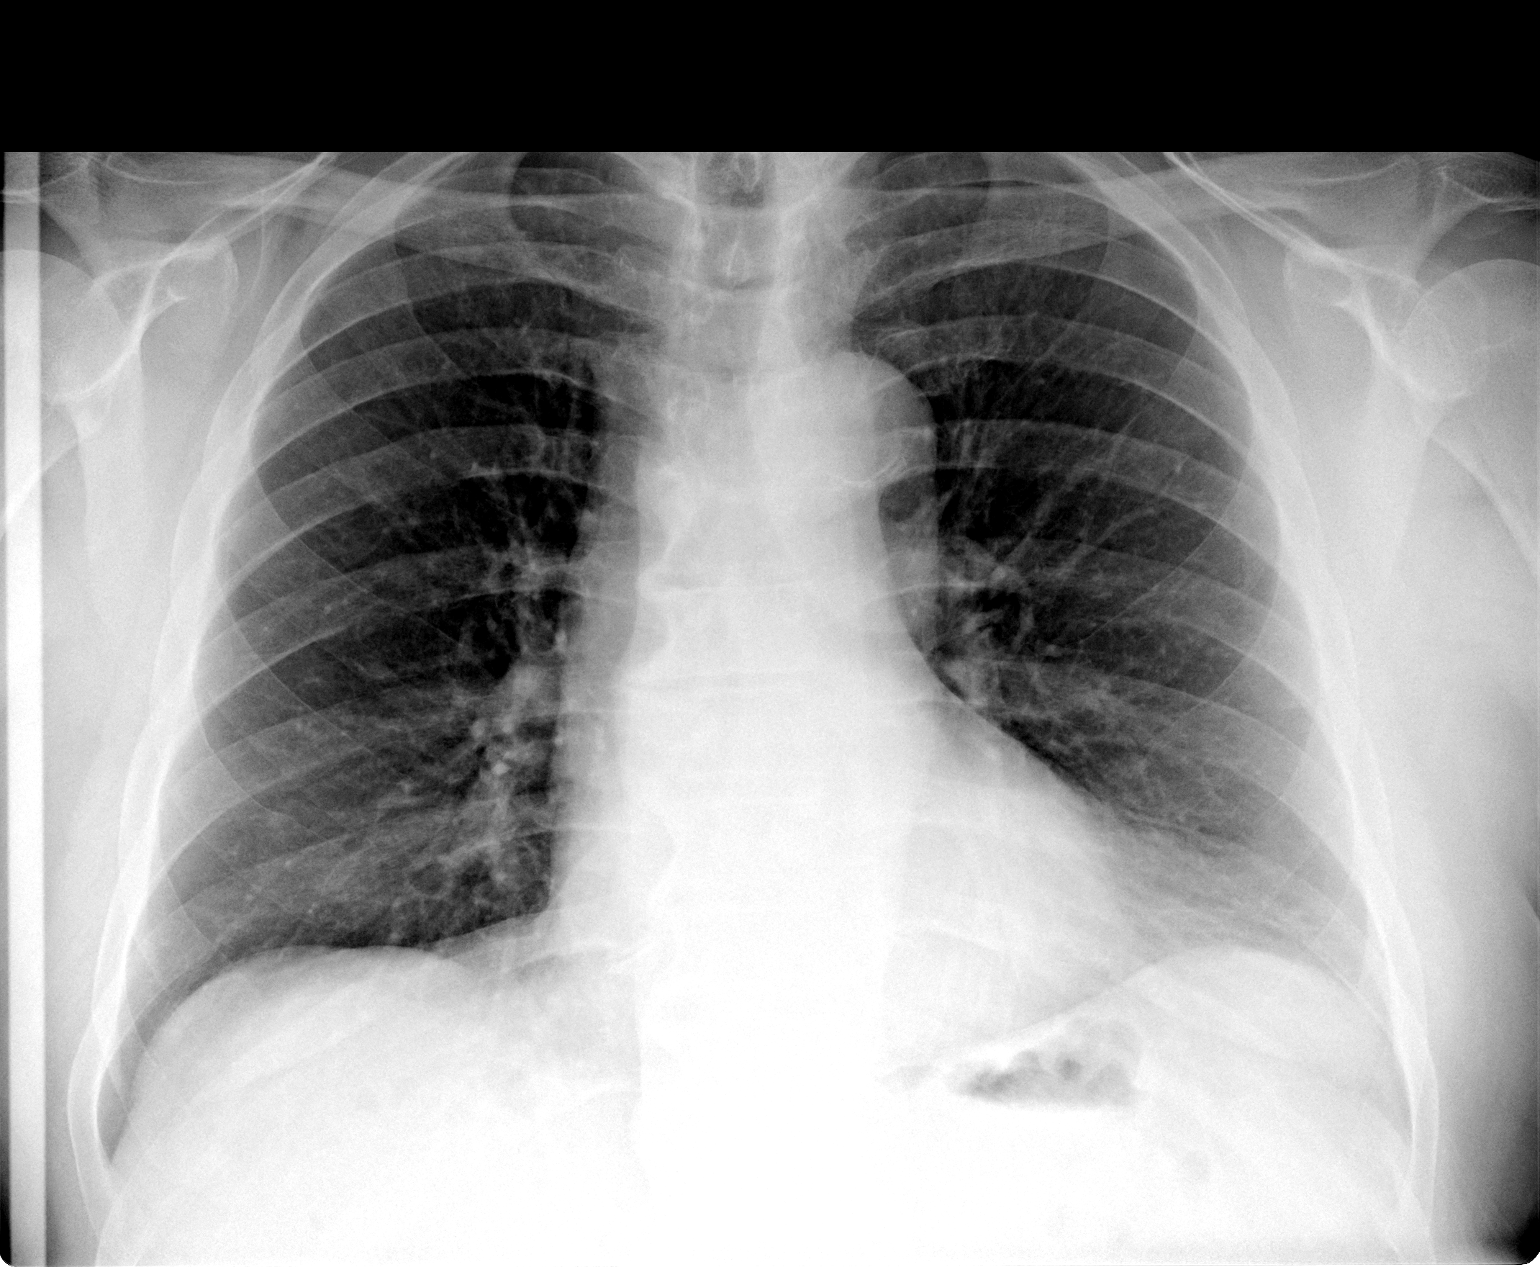

[view not recorded (2 of 2)]
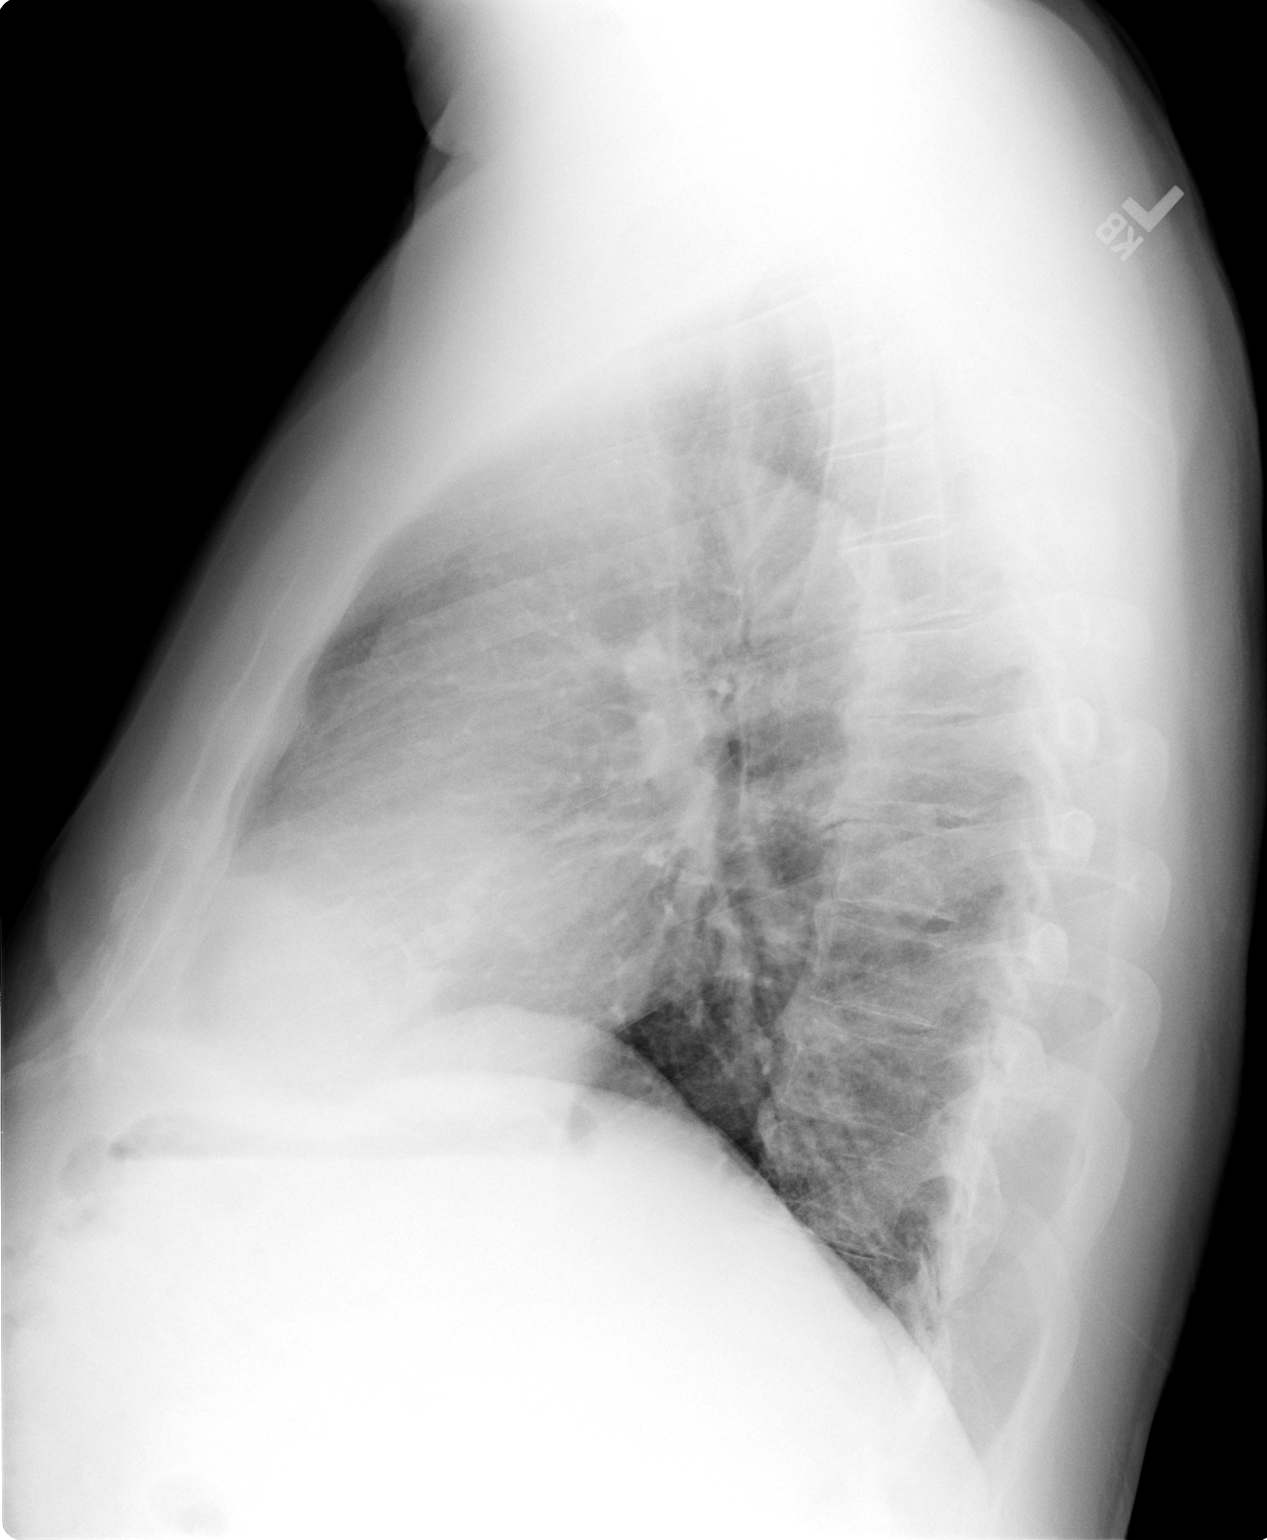

[2 of 2 positions shown; findings below may reference images not displayed]

FINDINGS: The cardiac silhouette is normal size and shape. Ectasia
and nonaneurysmal calcification of the thoracic aorta is seen. The
lungs are well aerated and free of infiltrates. No pleural
abnormality is evident. Osteophytes are seen in the spine.
IMPRESSION: No acute or active cardiopulmonary process is identified.

## 2011-10-24 IMAGING — CR DG CHEST 1V PORT
1 series · 1 of 1 positions shown · non-contrast
Comparison: 09/16/2009

CLINICAL DATA: AVR.

PORTABLE CHEST - 1 VIEW

[view not recorded]
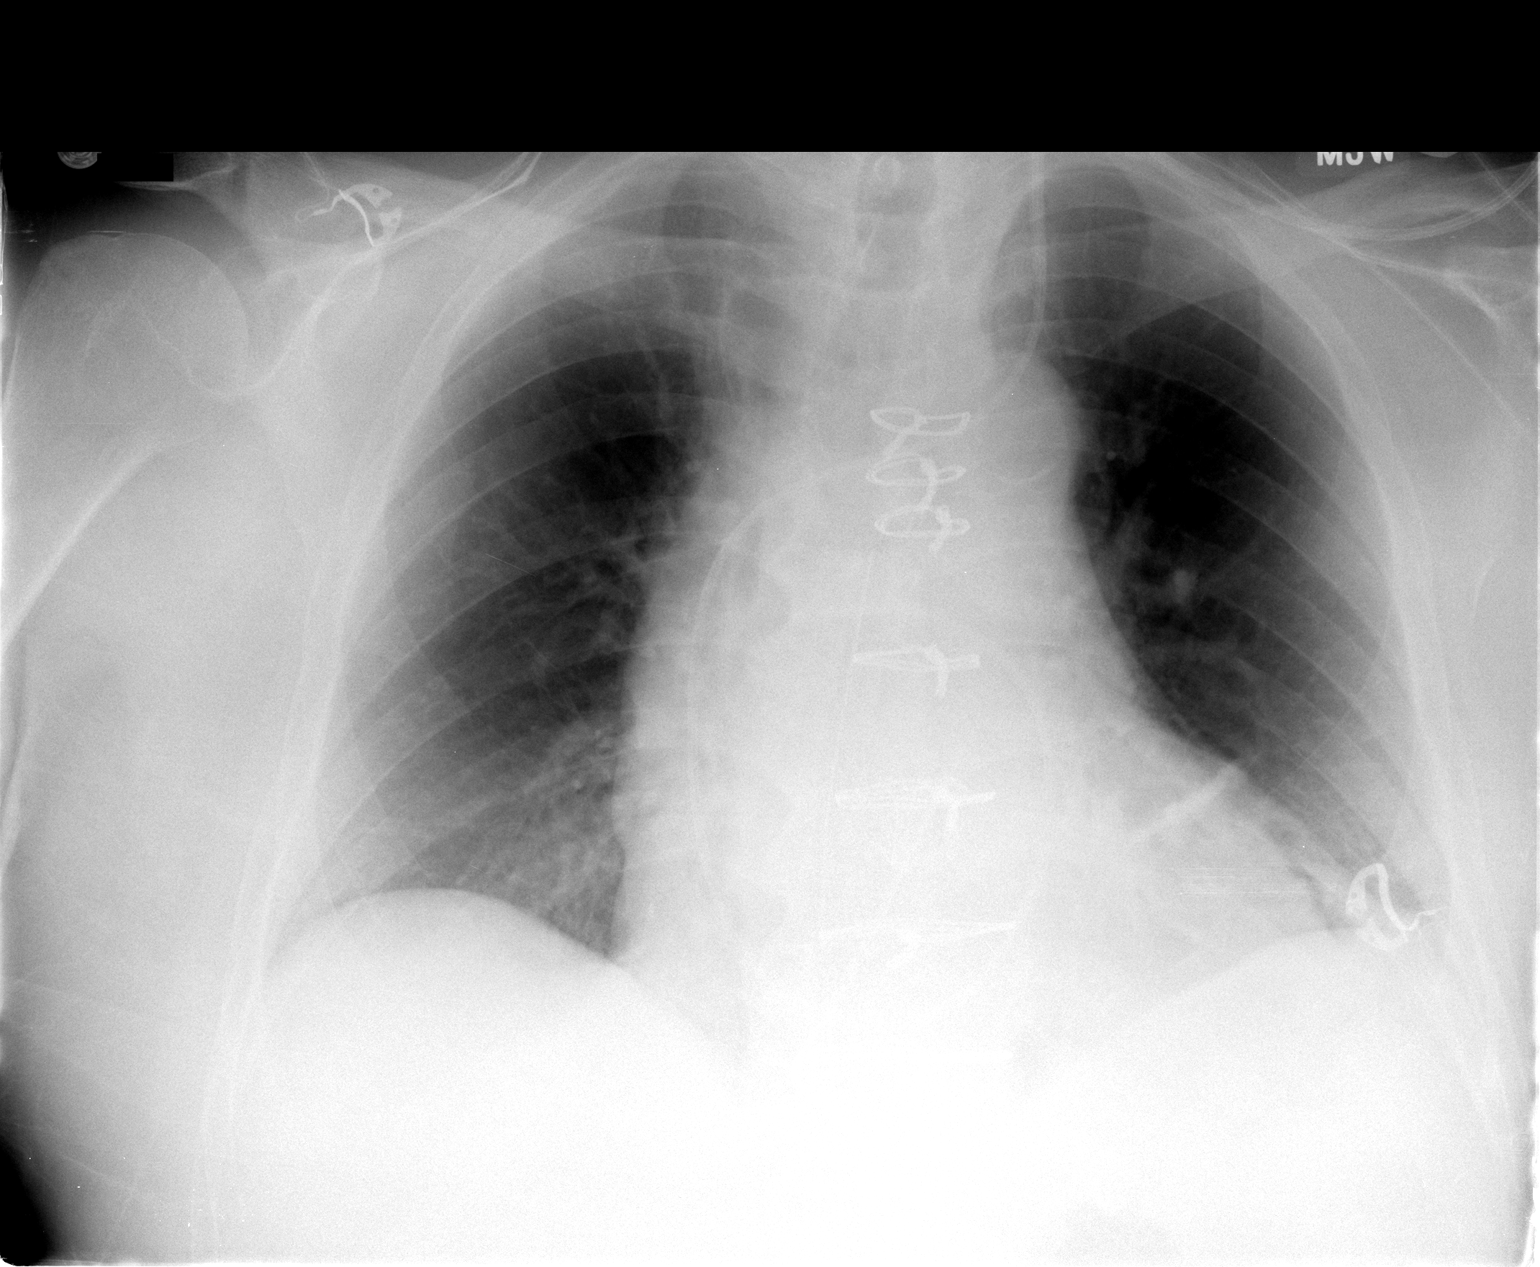

[1 of 1 positions shown; findings below may reference images not displayed]

FINDINGS: Improved aeration at the bases.  Linear atelectasis left
lower lung zone.  ETT is been removed.  SGC is in the right main
pulmonary artery segment.  Mediastinal chest tubes.
IMPRESSION: Minimal atelectasis left lower lung zone.

## 2011-10-25 IMAGING — CR DG CHEST 2V
2 series · 2 of 2 positions shown · non-contrast
Comparison: 09/17/2009

CLINICAL DATA: CABG.

CHEST - 2 VIEW

[w chest pa]
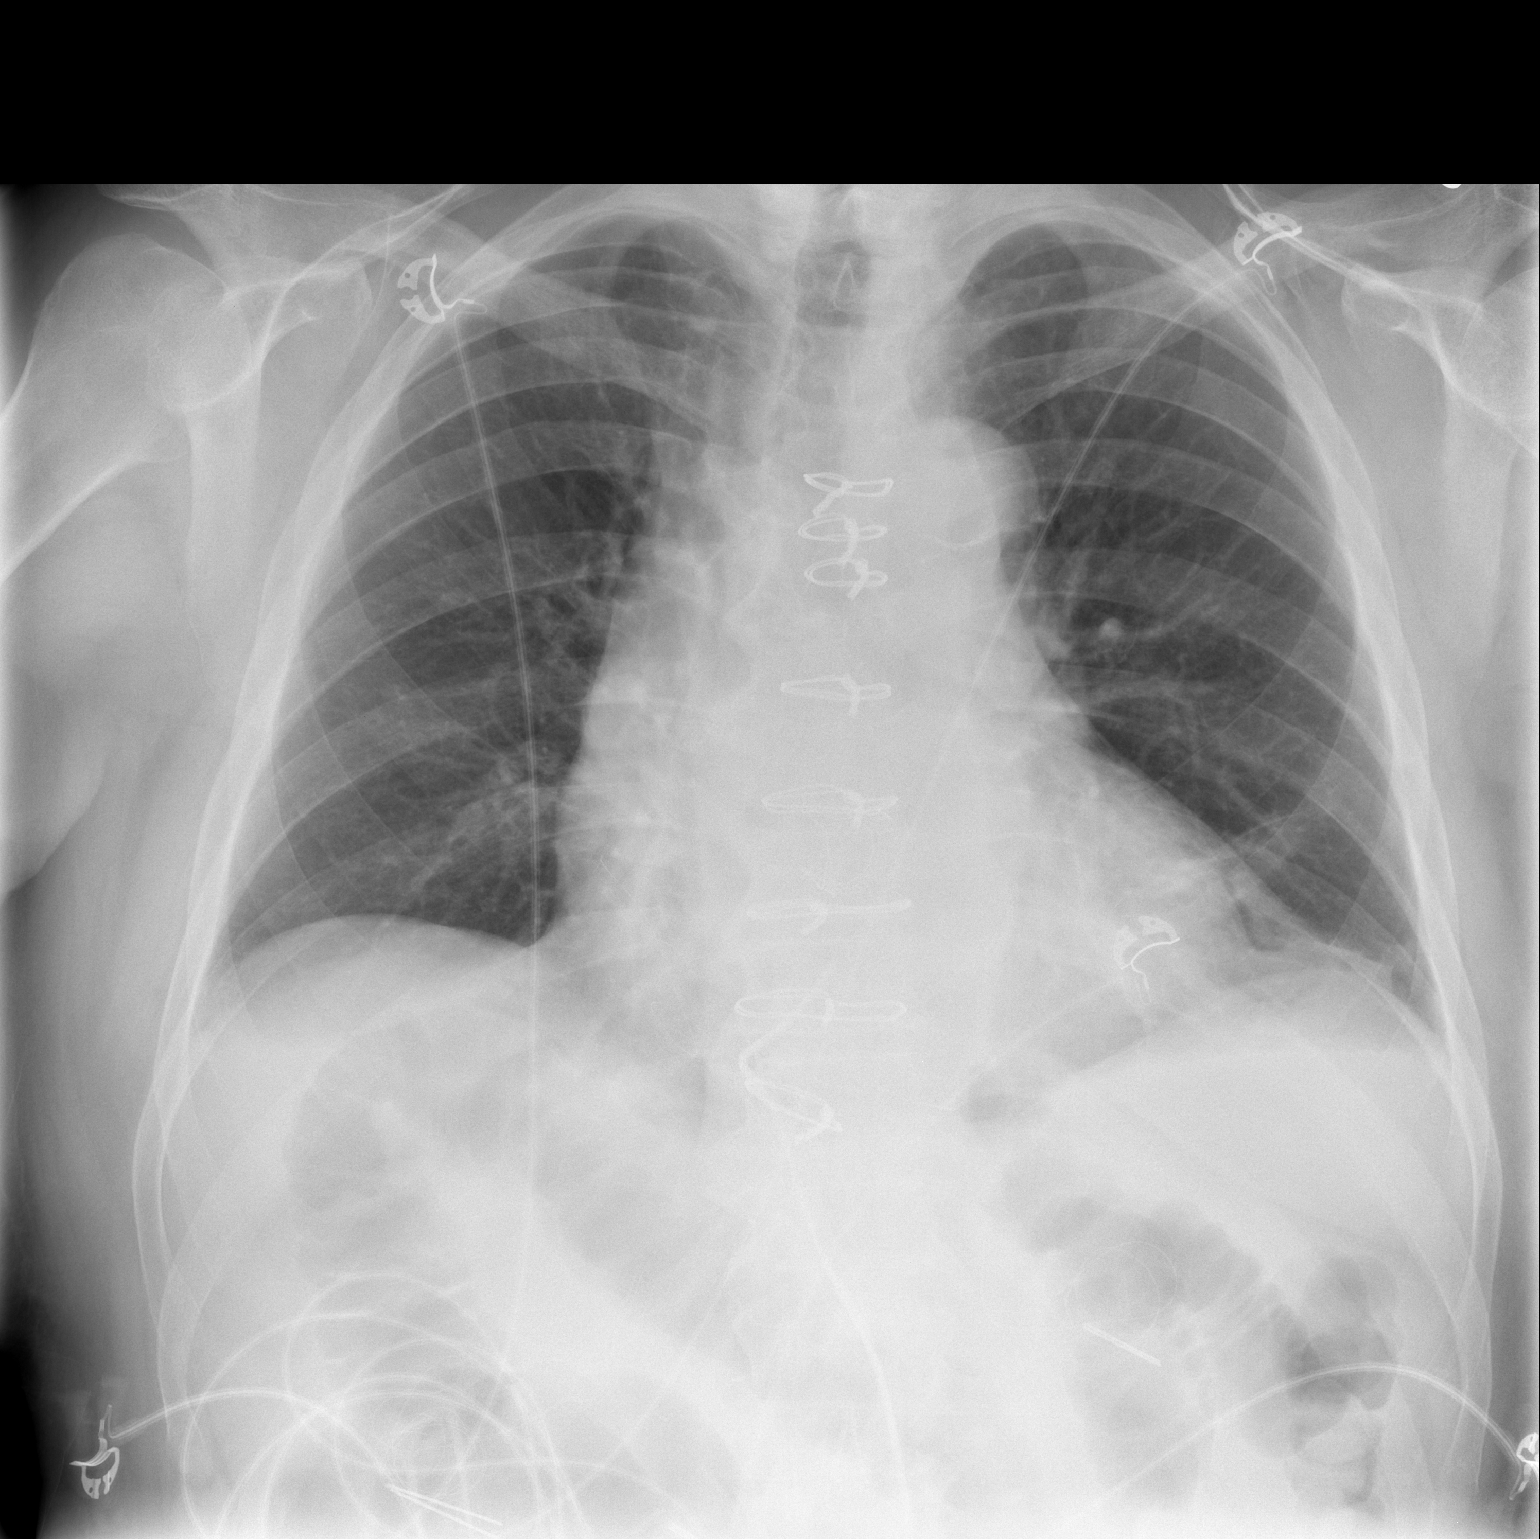

[w chest lat]
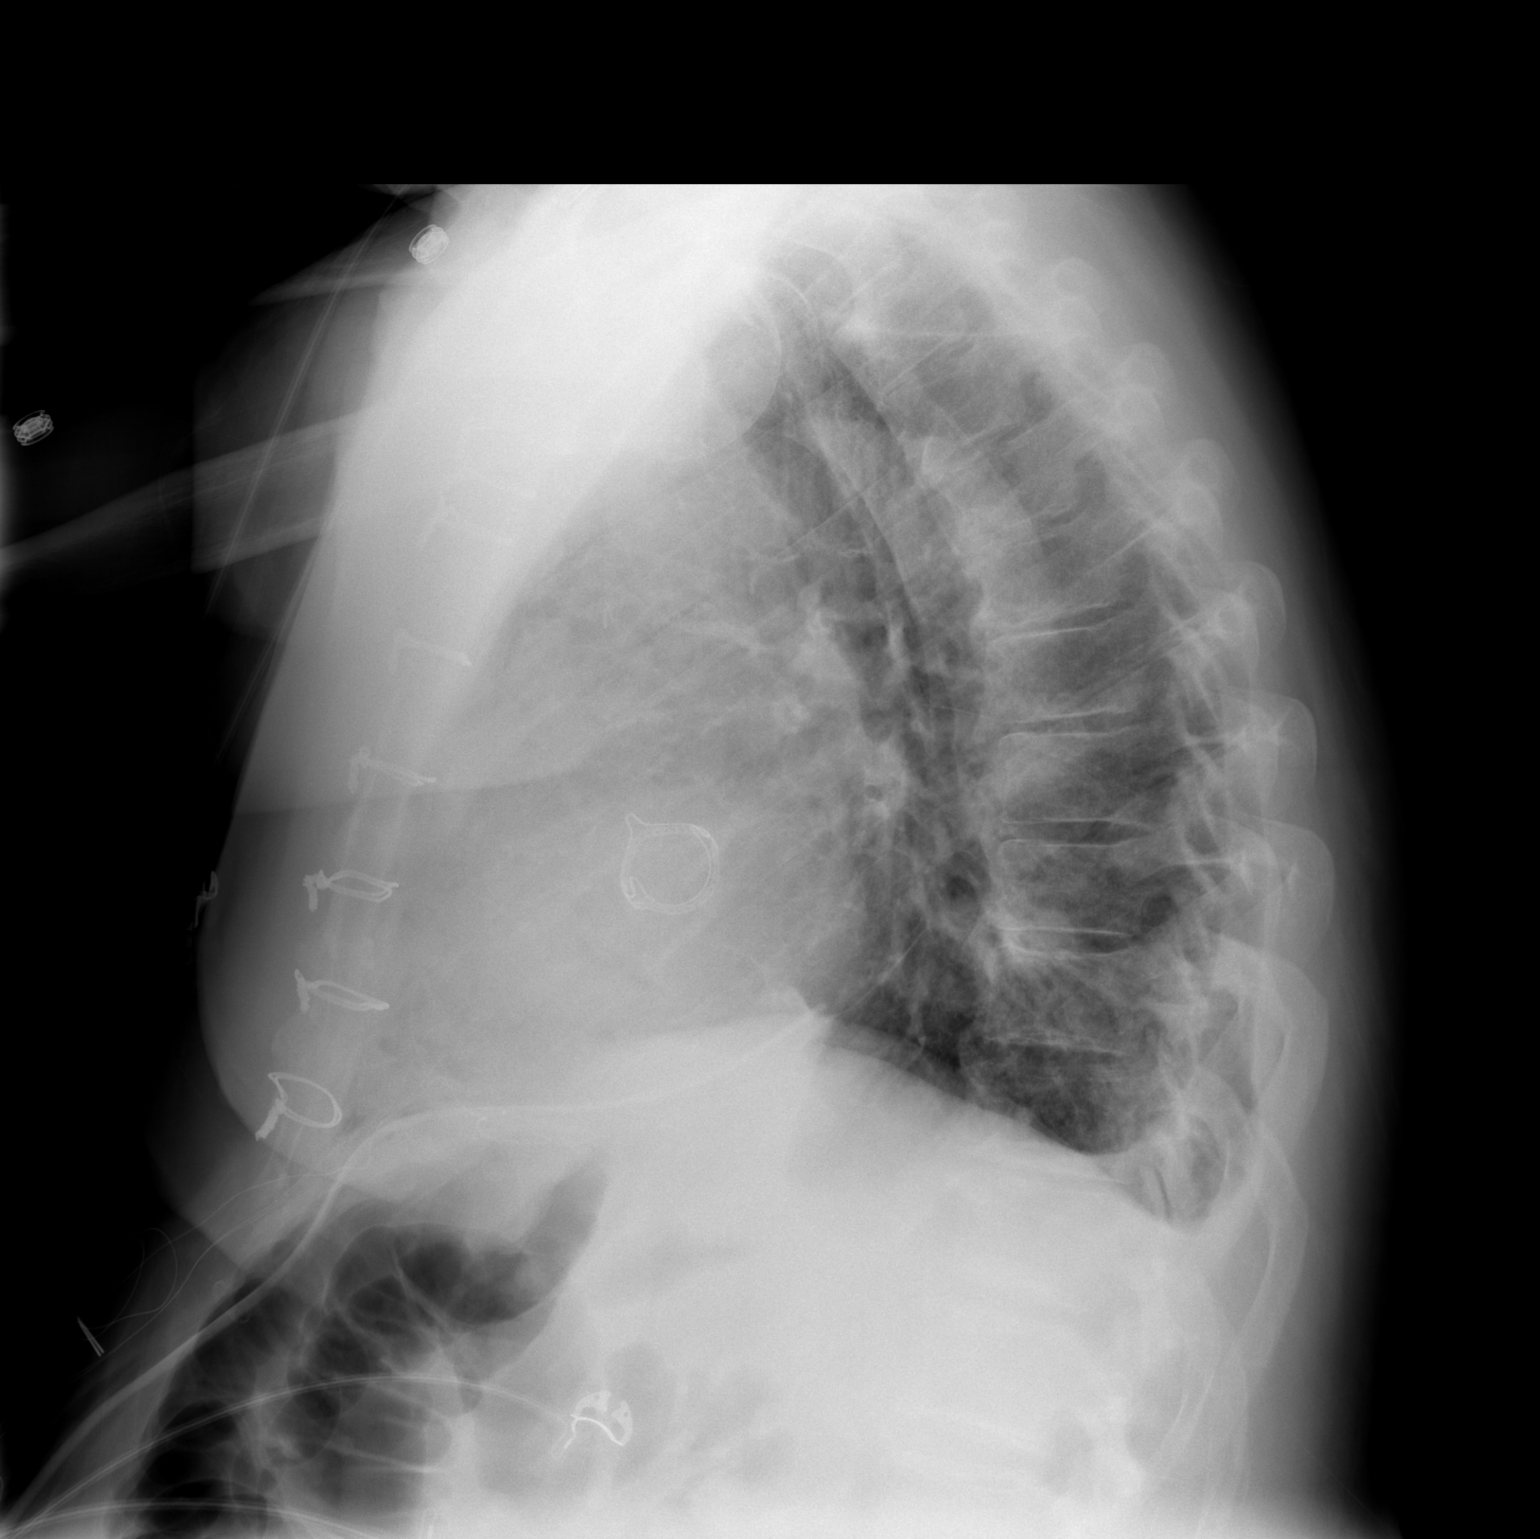

[2 of 2 positions shown; findings below may reference images not displayed]

FINDINGS: Interval removal of left Swan-Ganz catheter.  Left lower
lobe opacity noted, likely atelectasis, unchanged.  Right lung
clear.  No pneumothorax.  The patient is status post valve
replacement.  No effusions.
IMPRESSION: Left basilar atelectasis.

Cardiomegaly.

## 2012-01-27 ENCOUNTER — Emergency Department (HOSPITAL_COMMUNITY): Payer: Medicare Other

## 2012-01-27 ENCOUNTER — Emergency Department (HOSPITAL_COMMUNITY)
Admission: EM | Admit: 2012-01-27 | Discharge: 2012-01-28 | Disposition: A | Discharge status: Home / Self Care | Payer: Medicare Other | Attending: Emergency Medicine | Admitting: Emergency Medicine

## 2012-01-27 ENCOUNTER — Encounter (HOSPITAL_COMMUNITY): Payer: Self-pay | Admitting: *Deleted

## 2012-01-27 DIAGNOSIS — E785 Hyperlipidemia, unspecified: Secondary | ICD-10-CM | POA: Insufficient documentation

## 2012-01-27 DIAGNOSIS — M2540 Effusion, unspecified joint: Secondary | ICD-10-CM | POA: Insufficient documentation

## 2012-01-27 DIAGNOSIS — Z9889 Other specified postprocedural states: Secondary | ICD-10-CM | POA: Insufficient documentation

## 2012-01-27 DIAGNOSIS — E119 Type 2 diabetes mellitus without complications: Secondary | ICD-10-CM | POA: Insufficient documentation

## 2012-01-27 DIAGNOSIS — S41159A Open bite of unspecified upper arm, initial encounter: Secondary | ICD-10-CM

## 2012-01-27 DIAGNOSIS — I10 Essential (primary) hypertension: Secondary | ICD-10-CM | POA: Insufficient documentation

## 2012-01-27 DIAGNOSIS — S51809A Unspecified open wound of unspecified forearm, initial encounter: Secondary | ICD-10-CM | POA: Insufficient documentation

## 2012-01-27 DIAGNOSIS — S61409A Unspecified open wound of unspecified hand, initial encounter: Secondary | ICD-10-CM | POA: Insufficient documentation

## 2012-01-27 DIAGNOSIS — S61459A Open bite of unspecified hand, initial encounter: Secondary | ICD-10-CM

## 2012-01-27 DIAGNOSIS — M129 Arthropathy, unspecified: Secondary | ICD-10-CM | POA: Insufficient documentation

## 2012-01-27 DIAGNOSIS — M7989 Other specified soft tissue disorders: Secondary | ICD-10-CM | POA: Insufficient documentation

## 2012-01-27 DIAGNOSIS — S61419A Laceration without foreign body of unspecified hand, initial encounter: Secondary | ICD-10-CM

## 2012-01-27 DIAGNOSIS — W540XXA Bitten by dog, initial encounter: Secondary | ICD-10-CM | POA: Insufficient documentation

## 2012-01-27 NOTE — ED Notes (Signed)
The pt has multiple lacerations to the lt wrist and forearm.  He was breaking up a fight between 2 pitt bulls and was bitten .  It was his dog.  He is able to flex and extend his fingers.minimal bleeding from the wounds

## 2012-01-27 NOTE — ED Notes (Signed)
Pt remains in waiting room.  No distress noted.  Updated on POC and wait times. 

## 2012-01-28 ENCOUNTER — Encounter (HOSPITAL_COMMUNITY): Payer: Self-pay | Admitting: Emergency Medicine

## 2012-01-28 MED ORDER — HYDROCODONE-ACETAMINOPHEN 5-325 MG PO TABS: 1.0000 | ORAL_TABLET | ORAL | Status: AC | PRN

## 2012-01-28 MED ORDER — AMOXICILLIN-POT CLAVULANATE 875-125 MG PO TABS: 1.0000 | ORAL_TABLET | Freq: Two times a day (BID) | ORAL | Status: AC

## 2012-01-28 MED ORDER — DOCUSATE SODIUM 100 MG PO CAPS: 100.0000 mg | ORAL_CAPSULE | Freq: Two times a day (BID) | ORAL | Status: AC | PRN

## 2012-01-28 MED ORDER — SODIUM CHLORIDE 0.9 % IV SOLN
3.0000 g | Freq: Once | INTRAVENOUS | Status: AC
  Administered 2012-01-28: 3 g via INTRAVENOUS
  Filled 2012-01-28: qty 3

## 2012-01-28 MED ORDER — TETANUS-DIPHTHERIA TOXOIDS TD 5-2 LFU IM INJ
0.5000 mL | INJECTION | Freq: Once | INTRAMUSCULAR | Status: AC
  Administered 2012-01-28: 0.5 mL via INTRAMUSCULAR
  Filled 2012-01-28: qty 0.5

## 2012-01-28 NOTE — Discharge Instructions (Signed)
Please call Dr. Merrilee Seashore office on Monday for an appointment to be seen for reevaluation of your hand and forearm.  Please return to the emergency department if you start seeing worsening redness, swelling or worsening pain or fevers.  You may wash your wounds with soap and water and place antibiotic ointment on them and then cover them with gauze dressings.  Animal Bite An animal bite can result in a scratch on the skin, deep open cut, puncture of the skin, crush injury, or tearing away of the skin or a body part. Dogs are responsible for most animal bites. Children are bitten more often than adults. An animal bite can range from very mild to more serious. A small bite from your house pet is no cause for alarm. However, some animal bites can become infected or injure a bone or other tissue. You must seek medical care if:  The skin is broken and bleeding does not slow down or stop after 15 minutes.   The puncture is deep and difficult to clean (such as a cat bite).   Pain, warmth, redness, or pus develops around the wound.   The bite is from a stray animal or rodent. There may be a risk of rabies infection.   The bite is from a snake, raccoon, skunk, fox, coyote, or bat. There may be a risk of rabies infection.   The person bitten has a chronic illness such as diabetes, liver disease, or cancer, or the person takes medicine that lowers the immune system.   There is concern about the location and severity of the bite.  It is important to clean and protect an animal bite wound right away to prevent infection. Follow these steps:  Clean the wound with plenty of water and soap.   Apply an antibiotic cream.   Apply gentle pressure over the wound with a clean towel or gauze to slow or stop bleeding.   Elevate the affected area above the heart to help stop any bleeding.   Seek medical care. Getting medical care within 8 hours of the animal bite leads to the best possible outcome.  DIAGNOSIS    Your caregiver will most likely:  Take a detailed history of the animal and the bite injury.   Perform a wound exam.   Take your medical history.  Blood tests or X-rays may be performed. Sometimes, infected bite wounds are cultured and sent to a lab to identify the infectious bacteria.  TREATMENT  Medical treatment will depend on the location and type of animal bite as well as the patient's medical history. Treatment may include:  Wound care, such as cleaning and flushing the wound with saline solution, bandaging, and elevating the affected area.   Antibiotics.   Tetanus immunization.   Rabies immunization.   Leaving the wound open to heal. This is often done with animal bites, due to the high risk of infection. However, in certain cases, wound closure with stitches, wound adhesive, skin adhesive strips, or staples may be used.  Infected bites that are left untreated may require intravenous (IV) antibiotics and surgical treatment in the hospital. HOME CARE INSTRUCTIONS  Follow your caregiver's instructions for wound care.   Take all medicines as directed.   If your caregiver prescribes antibiotics, take them as directed. Finish them even if you start to feel better.   Follow up with your caregiver for further exams or immunizations as directed.  You may need a tetanus shot if:  You cannot remember when  you had your last tetanus shot.   You have never had a tetanus shot.   The injury broke your skin.  If you get a tetanus shot, your arm may swell, get red, and feel warm to the touch. This is common and not a problem. If you need a tetanus shot and you choose not to have one, there is a rare chance of getting tetanus. Sickness from tetanus can be serious. SEEK MEDICAL CARE IF:  You notice warmth, redness, soreness, swelling, pus discharge, or a bad smell coming from the wound.   You have a red line on the skin coming from the wound.   You have a fever, chills, or a  general ill feeling.   You have nausea or vomiting.   You have continued or worsening pain.   You have trouble moving the injured part.   You have other questions or concerns.  MAKE SURE YOU:  Understand these instructions.   Will watch your condition.   Will get help right away if you are not doing well or get worse.  Document Released: 07/20/2011 Document Revised: 10/21/2011 Document Reviewed: 07/20/2011 [facility] Patient Information 2012 [place], [place].Laceration Care, Adult A laceration is a cut or lesion that goes through all layers of the skin and into the tissue just beneath the skin. TREATMENT  Some lacerations may not require closure. Some lacerations may not be able to be closed due to an increased risk of infection. It is important to see your caregiver as soon as possible after an injury to minimize the risk of infection and maximize the opportunity for successful closure. If closure is appropriate, pain medicines may be given, if needed. The wound will be cleaned to help prevent infection. Your caregiver will use stitches (sutures), staples, wound glue (adhesive), or skin adhesive strips to repair the laceration. These tools bring the skin edges together to allow for faster healing and a better cosmetic outcome. However, all wounds will heal with a scar. Once the wound has healed, scarring can be minimized by covering the wound with sunscreen during the day for 1 full year. HOME CARE INSTRUCTIONS  For sutures or staples:  Keep the wound clean and dry.   If you were given a bandage (dressing), you should change it at least once a day. Also, change the dressing if it becomes wet or dirty, or as directed by your caregiver.   Wash the wound with soap and water 2 times a day. Rinse the wound off with water to remove all soap. Pat the wound dry with a clean towel.   After cleaning, apply a thin layer of the antibiotic ointment as recommended by your caregiver. This will help  prevent infection and keep the dressing from sticking.   You may shower as usual after the first 24 hours. Do not soak the wound in water until the sutures are removed.   Only take over-the-counter or prescription medicines for pain, discomfort, or fever as directed by your caregiver.   Get your sutures or staples removed as directed by your caregiver.  For skin adhesive strips:  Keep the wound clean and dry.   Do not get the skin adhesive strips wet. You may bathe carefully, using caution to keep the wound dry.   If the wound gets wet, pat it dry with a clean towel.   Skin adhesive strips will fall off on their own. You may trim the strips as the wound heals. Do not remove skin adhesive strips that  are still stuck to the wound. They will fall off in time.  For wound adhesive:  You may briefly wet your wound in the shower or bath. Do not soak or scrub the wound. Do not swim. Avoid periods of heavy perspiration until the skin adhesive has fallen off on its own. After showering or bathing, gently pat the wound dry with a clean towel.   Do not apply liquid medicine, cream medicine, or ointment medicine to your wound while the skin adhesive is in place. This may loosen the film before your wound is healed.   If a dressing is placed over the wound, be careful not to apply tape directly over the skin adhesive. This may cause the adhesive to be pulled off before the wound is healed.   Avoid prolonged exposure to sunlight or tanning lamps while the skin adhesive is in place. Exposure to ultraviolet light in the first year will darken the scar.   The skin adhesive will usually remain in place for 5 to 10 days, then naturally fall off the skin. Do not pick at the adhesive film.  You may need a tetanus shot if:  You cannot remember when you had your last tetanus shot.   You have never had a tetanus shot.  If you get a tetanus shot, your arm may swell, get red, and feel warm to the touch. This is  common and not a problem. If you need a tetanus shot and you choose not to have one, there is a rare chance of getting tetanus. Sickness from tetanus can be serious. SEEK MEDICAL CARE IF:   You have redness, swelling, or increasing pain in the wound.   You see a red line that goes away from the wound.   You have yellowish-white fluid (pus) coming from the wound.   You have a fever.   You notice a bad smell coming from the wound or dressing.   Your wound breaks open before or after sutures have been removed.   You notice something coming out of the wound such as wood or glass.   Your wound is on your hand or foot and you cannot move a finger or toe.  SEEK IMMEDIATE MEDICAL CARE IF:   Your pain is not controlled with prescribed medicine.   You have severe swelling around the wound causing pain and numbness or a change in color in your arm, hand, leg, or foot.   Your wound splits open and starts bleeding.   You have worsening numbness, weakness, or loss of function of any joint around or beyond the wound.   You develop painful lumps near the wound or on the skin anywhere on your body.  MAKE SURE YOU:   Understand these instructions.   Will watch your condition.   Will get help right away if you are not doing well or get worse.  Document Released: 11/01/2005 Document Revised: 10/21/2011 Document Reviewed: 04/27/2011 [employer] Patient Information 2012 [place], [place].

## 2012-01-28 NOTE — ED Provider Notes (Addendum)
History     CSN: [id number]  Arrival date & time 01/27/12  1191   First MD Initiated Contact with Patient 01/28/12 0011      Chief Complaint  Patient presents with  . Animal Bite    (Consider location/radiation/quality/duration/timing/severity/associated sxs/prior treatment) HPI Comments: Patient was breaking up a fight between 2 pitbulls and was accidentally bitten on the left distal forearm and hand.  He notes that the dogs are his grandsons and have had all their immunizations.  Patient denies any other injuries.  He is left-hand dominant. He is able to perform range of motion of his wrist and had and denies any numbness.   Patient is a 78 y.o. male presenting with animal bite. The history is provided by the patient. No language interpreter was used.  Animal Bite  The incident occurred today. The incident occurred at home. Pertinent negatives include no chest pain, no abdominal pain, no nausea, no vomiting, no headaches and no cough.    Past Medical History  Diagnosis Date  . Hyperlipidemia   . Hypertension   . Arthritis   . Aortic stenosis   . Renal insufficiency   . Type 2 diabetes mellitus     borderline  . Anemia     Past Surgical History  Procedure Date  . Back surgery   . Knee surgery     Left Knee  . Tonsillectomy   . Aortic valve replacement November 2010    History reviewed. No pertinent family history.  History  Substance Use Topics  . Smoking status: Never Smoker   . Smokeless tobacco: Not on file  . Alcohol Use: No      Review of Systems  Constitutional: Negative.  Negative for fever and chills.  HENT: Negative.   Eyes: Negative.  Negative for discharge and redness.  Respiratory: Negative.  Negative for cough and shortness of breath.   Cardiovascular: Negative.  Negative for chest pain.  Gastrointestinal: Negative.  Negative for nausea, vomiting and abdominal pain.  Genitourinary: Negative.  Negative for hematuria.  Musculoskeletal:  Positive for joint swelling. Negative for back pain.  Skin: Positive for wound. Negative for color change and rash.  Neurological: Negative for syncope and headaches.  Hematological: Negative.  Negative for adenopathy.  Psychiatric/Behavioral: Negative.  Negative for confusion.  All other systems reviewed and are negative.    Allergies  Review of patient's allergies indicates no known allergies.  Home Medications   Current Outpatient Rx  Name Route Sig Dispense Refill  . AMLODIPINE BESYLATE 10 MG PO TABS Oral Take 10 mg by mouth at bedtime.    . ASPIRIN 81 MG PO TABS Oral Take 81 mg by mouth at bedtime.     Marland Kitchen PRAVASTATIN SODIUM 40 MG PO TABS Oral Take 40 mg by mouth at bedtime.      BP 143/67  Pulse 69  Temp(Src) 98.6 F (37 C) (Oral)  Resp 18  SpO2 97%  Physical Exam  Nursing note and vitals reviewed. Constitutional: He is oriented to person, place, and time. He appears well-developed and well-nourished.  Non-toxic appearance. He does not have a sickly appearance.  HENT:  Head: Normocephalic and atraumatic.  Eyes: Conjunctivae, EOM and lids are normal. Pupils are equal, round, and reactive to light.  Neck: Trachea normal, normal range of motion and full passive range of motion without pain. Neck supple.  Cardiovascular: Normal rate, regular rhythm and normal heart sounds.   Pulmonary/Chest: Effort normal and breath sounds normal. No respiratory distress.  Abdominal: Soft. Normal appearance. He exhibits no distension. There is no tenderness. There is no rebound and no CVA tenderness.  Musculoskeletal: Normal range of motion.       Arms:      Patient also has extension of his digits with with good strength  Neurological: He is alert and oriented to person, place, and time. He has normal strength.  Skin: Skin is warm, dry and intact. No rash noted.  Psychiatric: He has a normal mood and affect. His behavior is normal. Judgment and thought content normal.    ED Course    LACERATION REPAIR Date/Time: 01/28/2012 1:01 AM Performed by: Emeline General A Authorized by: Emeline General A Consent: Verbal consent obtained. Risks and benefits: risks, benefits and alternatives were discussed Consent given by: patient Patient understanding: patient states understanding of the procedure being performed Required items: required blood products, implants, devices, and special equipment available Patient identity confirmed: verbally with patient Body area: upper extremity Location details: left hand Laceration length: 4 cm Foreign bodies: no foreign bodies Tendon involvement: none Nerve involvement: none Vascular damage: no Anesthesia: local infiltration Local anesthetic: lidocaine 2% with epinephrine Anesthetic total: 3 ml Patient sedated: no Preparation: Patient was prepped and draped in the usual sterile fashion. Irrigation solution: saline Irrigation method: syringe Amount of cleaning: extensive (Patient's distal forearm and hand wounds were irrigated with 3 L of normal saline) Debridement: none Degree of undermining: none Mucous membrane closure: 4-0 Chromic gut Number of sutures: 2 Approximation: loose Approximation difficulty: simple Dressing: antibiotic ointment, non-adhesive packing strip, 4x4 sterile gauze and gauze roll Patient tolerance: Patient tolerated the procedure well with no immediate complications. Comments: I only loosely close the wound over the third and fourth MCP joints so that it would not continue to gape but left it with ability to drain if needed   (including critical care time)  Labs Reviewed - No data to display Dg Forearm Left  01/27/2012  *RADIOLOGY REPORT*  Clinical Data: Dog bite  LEFT FOREARM - 2 VIEW  Comparison: None.  Findings: No acute fracture and no dislocation.  Soft tissue swelling over the distal forearm is noted. Bony density adjacent to the lateral epicondyle has a chronic appearance.  IMPRESSION: No acute bony  pathology.  Original Report Authenticated By: Donavan Burnet, M.D.     No diagnosis found.    MDM  Patient with multiple wounds to his distal left forearm in hand.  No underlying fractures are present on his x-ray.  Patient will require repair loosely of the wound at his left knuckles.  The other wounds will be left open as there otherwise well approximated.  Patient will get a dose of antibiotics here and will go home on Augmentin with followup with the hand specialist on Monday given the significance of these wounds and concern for infection that may occur.  Patient will also have his tetanus shot updated here today.        Nat Christen, MD 01/28/12 [id number]  Nat Christen, MD 01/28/12 0103  I thoroughly went over the instructions with the patient and his family member regarding wound care and precautions for infection.  He understands to return this weekend if he begins to see worsening redness, swelling, worsening pain or fevers.  Nat Christen, MD 01/28/12 [phone]

## 2012-02-12 ENCOUNTER — Other Ambulatory Visit: Payer: Self-pay | Admitting: Cardiology

## 2012-03-31 ENCOUNTER — Other Ambulatory Visit: Payer: Self-pay | Admitting: Orthopedic Surgery

## 2012-04-03 ENCOUNTER — Encounter (HOSPITAL_BASED_OUTPATIENT_CLINIC_OR_DEPARTMENT_OTHER): Payer: Self-pay | Admitting: *Deleted

## 2012-04-03 NOTE — Progress Notes (Signed)
Will need istat Grandson to bring-stays with him- Had ekg 10/12

## 2012-04-04 ENCOUNTER — Encounter (HOSPITAL_BASED_OUTPATIENT_CLINIC_OR_DEPARTMENT_OTHER)
Admission: RE | Disposition: A | Discharge status: Home / Self Care | Payer: Self-pay | Source: Ambulatory Visit | Attending: Orthopedic Surgery

## 2012-04-04 ENCOUNTER — Encounter (HOSPITAL_BASED_OUTPATIENT_CLINIC_OR_DEPARTMENT_OTHER): Payer: Self-pay | Admitting: Certified Registered Nurse Anesthetist

## 2012-04-04 ENCOUNTER — Ambulatory Visit (HOSPITAL_BASED_OUTPATIENT_CLINIC_OR_DEPARTMENT_OTHER)
Admission: RE | Admit: 2012-04-04 | Discharge: 2012-04-04 | Disposition: A | Discharge status: Home / Self Care | Payer: Medicare Other | Source: Ambulatory Visit | Attending: Orthopedic Surgery | Admitting: Orthopedic Surgery

## 2012-04-04 ENCOUNTER — Encounter (HOSPITAL_BASED_OUTPATIENT_CLINIC_OR_DEPARTMENT_OTHER): Payer: Self-pay | Admitting: Orthopedic Surgery

## 2012-04-04 ENCOUNTER — Encounter (HOSPITAL_BASED_OUTPATIENT_CLINIC_OR_DEPARTMENT_OTHER): Payer: Self-pay | Admitting: Anesthesiology

## 2012-04-04 ENCOUNTER — Ambulatory Visit (HOSPITAL_BASED_OUTPATIENT_CLINIC_OR_DEPARTMENT_OTHER): Payer: Medicare Other | Admitting: Anesthesiology

## 2012-04-04 DIAGNOSIS — M869 Osteomyelitis, unspecified: Secondary | ICD-10-CM | POA: Insufficient documentation

## 2012-04-04 DIAGNOSIS — H9190 Unspecified hearing loss, unspecified ear: Secondary | ICD-10-CM | POA: Insufficient documentation

## 2012-04-04 DIAGNOSIS — E1169 Type 2 diabetes mellitus with other specified complication: Secondary | ICD-10-CM | POA: Insufficient documentation

## 2012-04-04 DIAGNOSIS — I2510 Atherosclerotic heart disease of native coronary artery without angina pectoris: Secondary | ICD-10-CM | POA: Insufficient documentation

## 2012-04-04 DIAGNOSIS — M9080 Osteopathy in diseases classified elsewhere, unspecified site: Secondary | ICD-10-CM | POA: Insufficient documentation

## 2012-04-04 DIAGNOSIS — N289 Disorder of kidney and ureter, unspecified: Secondary | ICD-10-CM | POA: Insufficient documentation

## 2012-04-04 DIAGNOSIS — I10 Essential (primary) hypertension: Secondary | ICD-10-CM | POA: Insufficient documentation

## 2012-04-04 DIAGNOSIS — E785 Hyperlipidemia, unspecified: Secondary | ICD-10-CM | POA: Insufficient documentation

## 2012-04-04 DIAGNOSIS — M129 Arthropathy, unspecified: Secondary | ICD-10-CM | POA: Insufficient documentation

## 2012-04-04 DIAGNOSIS — M7020 Olecranon bursitis, unspecified elbow: Secondary | ICD-10-CM | POA: Insufficient documentation

## 2012-04-04 DIAGNOSIS — I359 Nonrheumatic aortic valve disorder, unspecified: Secondary | ICD-10-CM | POA: Insufficient documentation

## 2012-04-04 DIAGNOSIS — D649 Anemia, unspecified: Secondary | ICD-10-CM | POA: Insufficient documentation

## 2012-04-04 HISTORY — PX: OLECRANON BURSECTOMY: SHX2097

## 2012-04-04 HISTORY — DX: Unspecified hearing loss, unspecified ear: H91.90

## 2012-04-04 LAB — POCT I-STAT, CHEM 8
Calcium, Ion: 1.18 mmol/L (ref 1.12–1.32)
Chloride: 109 mEq/L (ref 96–112)
Creatinine, Ser: 2.4 mg/dL — ABNORMAL HIGH (ref 0.50–1.35)
Glucose, Bld: 156 mg/dL — ABNORMAL HIGH (ref 70–99)
HCT: 37 % — ABNORMAL LOW (ref 39.0–52.0)
Hemoglobin: 12.6 g/dL — ABNORMAL LOW (ref 13.0–17.0)
Potassium: 4.6 mEq/L (ref 3.5–5.1)

## 2012-04-04 LAB — GLUCOSE, CAPILLARY: Glucose-Capillary: 125 mg/dL — ABNORMAL HIGH (ref 70–99)

## 2012-04-04 SURGERY — BURSECTOMY, ELBOW
Anesthesia: Choice | Laterality: Left | Wound class: Dirty or Infected

## 2012-04-04 MED ORDER — FENTANYL CITRATE 0.05 MG/ML IJ SOLN
50.0000 ug | INTRAMUSCULAR | Status: DC | PRN
  Administered 2012-04-04: 50 ug via INTRAVENOUS

## 2012-04-04 MED ORDER — MIDAZOLAM HCL 2 MG/2ML IJ SOLN
0.5000 mg | INTRAMUSCULAR | Status: DC | PRN
  Administered 2012-04-04: 1 mg via INTRAVENOUS

## 2012-04-04 MED ORDER — LIDOCAINE HCL 1 % IJ SOLN
INTRAMUSCULAR | Status: DC | PRN
  Administered 2012-04-04: 2 mL via INTRADERMAL

## 2012-04-04 MED ORDER — CHLORHEXIDINE GLUCONATE 4 % EX LIQD: 60.0000 mL | Freq: Once | CUTANEOUS | Status: DC

## 2012-04-04 MED ORDER — PROPOFOL 10 MG/ML IV EMUL
INTRAVENOUS | Status: DC | PRN
  Administered 2012-04-04: 30 mg via INTRAVENOUS
  Administered 2012-04-04: 20 mg via INTRAVENOUS
  Administered 2012-04-04: 30 mg via INTRAVENOUS

## 2012-04-04 MED ORDER — CEFAZOLIN SODIUM-DEXTROSE 2-3 GM-% IV SOLR: 2.0000 g | INTRAVENOUS | Status: DC

## 2012-04-04 MED ORDER — METOCLOPRAMIDE HCL 5 MG/ML IJ SOLN: 10.0000 mg | Freq: Once | INTRAMUSCULAR | Status: DC | PRN

## 2012-04-04 MED ORDER — ONDANSETRON HCL 4 MG/2ML IJ SOLN
INTRAMUSCULAR | Status: DC | PRN
  Administered 2012-04-04: 4 mg via INTRAVENOUS

## 2012-04-04 MED ORDER — PROPOFOL 10 MG/ML IV EMUL
INTRAVENOUS | Status: DC | PRN
  Administered 2012-04-04: 50 ug/kg/min via INTRAVENOUS

## 2012-04-04 MED ORDER — FENTANYL CITRATE 0.05 MG/ML IJ SOLN: 25.0000 ug | INTRAMUSCULAR | Status: DC | PRN

## 2012-04-04 MED ORDER — LACTATED RINGERS IV SOLN
INTRAVENOUS | Status: DC
  Administered 2012-04-04 (×2): via INTRAVENOUS

## 2012-04-04 MED ORDER — ROPIVACAINE HCL 5 MG/ML IJ SOLN
INTRAMUSCULAR | Status: DC | PRN
  Administered 2012-04-04: 25 mL via EPIDURAL

## 2012-04-04 MED ORDER — CEFAZOLIN SODIUM 1-5 GM-% IV SOLN
INTRAVENOUS | Status: DC | PRN
  Administered 2012-04-04: 2 g via INTRAVENOUS

## 2012-04-04 MED ORDER — CEFAZOLIN SODIUM 1-5 GM-% IV SOLN: 1.0000 g | INTRAVENOUS | Status: DC

## 2012-04-04 MED ORDER — HYDROCODONE-ACETAMINOPHEN 5-325 MG PO TABS: ORAL_TABLET | ORAL | Status: DC

## 2012-04-04 SURGICAL SUPPLY — 48 items
BAG DECANTER FOR FLEXI CONT (MISCELLANEOUS) IMPLANT
BANDAGE ELASTIC 3 VELCRO ST LF (GAUZE/BANDAGES/DRESSINGS) IMPLANT
BANDAGE ELASTIC 4 VELCRO ST LF (GAUZE/BANDAGES/DRESSINGS) ×1 IMPLANT
BANDAGE GAUZE ELAST BULKY 4 IN (GAUZE/BANDAGES/DRESSINGS) ×1 IMPLANT
BANDAGE GAUZE STRT 1 STR LF (GAUZE/BANDAGES/DRESSINGS) IMPLANT
BLADE MINI RND TIP GREEN BEAV (BLADE) IMPLANT
BLADE SURG 15 STRL LF DISP TIS (BLADE) ×2 IMPLANT
BLADE SURG 15 STRL SS (BLADE) ×4
BNDG CMPR 9X4 STRL LF SNTH (GAUZE/BANDAGES/DRESSINGS) ×1
BNDG COHESIVE 1X5 TAN STRL LF (GAUZE/BANDAGES/DRESSINGS) IMPLANT
BNDG ESMARK 4X9 LF (GAUZE/BANDAGES/DRESSINGS) ×1 IMPLANT
CHLORAPREP W/TINT 26ML (MISCELLANEOUS) ×1 IMPLANT
CLOTH BEACON ORANGE TIMEOUT ST (SAFETY) ×2 IMPLANT
CORDS BIPOLAR (ELECTRODE) ×2 IMPLANT
COVER MAYO STAND STRL (DRAPES) ×2 IMPLANT
COVER TABLE BACK 60X90 (DRAPES) ×2 IMPLANT
CUFF TOURNIQUET SINGLE 18IN (TOURNIQUET CUFF) ×2 IMPLANT
DRAPE EXTREMITY T 121X128X90 (DRAPE) ×2 IMPLANT
DRAPE SURG 17X23 STRL (DRAPES) ×2 IMPLANT
GAUZE PACKING IODOFORM 1/4X5 (PACKING) ×1 IMPLANT
GAUZE XEROFORM 1X8 LF (GAUZE/BANDAGES/DRESSINGS) ×1 IMPLANT
GLOVE BIO SURGEON STRL SZ7.5 (GLOVE) ×2 IMPLANT
GLOVE BIOGEL PI IND STRL 8 (GLOVE) ×1 IMPLANT
GLOVE BIOGEL PI INDICATOR 8 (GLOVE) ×1
GOWN PREVENTION PLUS XLARGE (GOWN DISPOSABLE) ×1 IMPLANT
GOWN STRL REIN XL XLG (GOWN DISPOSABLE) ×1 IMPLANT
LOOP VESSEL MAXI BLUE (MISCELLANEOUS) IMPLANT
NDL HYPO 25X1 1.5 SAFETY (NEEDLE) IMPLANT
NEEDLE HYPO 25X1 1.5 SAFETY (NEEDLE) IMPLANT
NS IRRIG 1000ML POUR BTL (IV SOLUTION) ×2 IMPLANT
PACK BASIN DAY SURGERY FS (CUSTOM PROCEDURE TRAY) ×2 IMPLANT
PAD CAST 3X4 CTTN HI CHSV (CAST SUPPLIES) IMPLANT
PADDING CAST ABS 4INX4YD NS (CAST SUPPLIES) ×1
PADDING CAST ABS COTTON 4X4 ST (CAST SUPPLIES) ×1 IMPLANT
PADDING CAST COTTON 3X4 STRL (CAST SUPPLIES)
SPLINT PLASTER CAST XFAST 3X15 (CAST SUPPLIES) IMPLANT
SPLINT PLASTER XTRA FASTSET 3X (CAST SUPPLIES)
SPONGE GAUZE 4X4 12PLY (GAUZE/BANDAGES/DRESSINGS) ×2 IMPLANT
STOCKINETTE 4X48 STRL (DRAPES) ×2 IMPLANT
SUT ETHILON 4 0 PS 2 18 (SUTURE) IMPLANT
SWAB CULTURE LIQ STUART DBL (MISCELLANEOUS) ×1 IMPLANT
SYR BULB 3OZ (MISCELLANEOUS) ×2 IMPLANT
SYR CONTROL 10ML LL (SYRINGE) IMPLANT
TOWEL OR 17X24 6PK STRL BLUE (TOWEL DISPOSABLE) ×2 IMPLANT
TUBE ANAEROBIC SPECIMEN COL (MISCELLANEOUS) ×1 IMPLANT
TUBE FEEDING 5FR 15 INCH (TUBING) IMPLANT
UNDERPAD 30X30 INCONTINENT (UNDERPADS AND DIAPERS) ×2 IMPLANT
WATER STERILE IRR 1000ML POUR (IV SOLUTION) ×1 IMPLANT

## 2012-04-04 NOTE — Anesthesia Procedure Notes (Signed)
Anesthesia Regional Block:  Supraclavicular block  Pre-Anesthetic Checklist: ,, timeout performed, Correct Patient, Correct Site, Correct Laterality, Correct Procedure, Correct Position, site marked, Risks and benefits discussed,  Surgical consent,  Pre-op evaluation,  At surgeon's request and post-op pain management  Laterality: Left  Prep: chloraprep       Needles:   Needle Type: Other   (Arrow Echogenic)   Needle Length: 9cm  Needle Gauge: 21    Additional Needles:  Procedures: ultrasound guided Supraclavicular block Narrative:  Start time: 04/04/2012 2:10 PM End time: 04/04/2012 2:19 PM Injection made incrementally with aspirations every 5 mL.  Performed by: Personally  Anesthesiologist: C Davie Sagona  Additional Notes: Ultrasound guidance used to: id relevant anatomy, confirm needle position, local anesthetic spread, avoidance of vascular puncture. Picture saved. No complications. Block performed personally by Janetta Hora. Gelene Mink, MD    Supraclavicular block

## 2012-04-04 NOTE — Anesthesia Postprocedure Evaluation (Signed)
Anesthesia Post Note  Patient: [name]  Procedure(s) Performed: Procedure(s) (LRB): OLECRANON BURSA (Left)  Anesthesia type: MAC  Patient location: PACU  Post pain: Pain level controlled  Post assessment: Patient's Cardiovascular Status Stable  Last Vitals:  Filed Vitals:   04/04/12 1700  BP: 131/69  Pulse: 73  Temp:   Resp: 14    Post vital signs: Reviewed and stable  Level of consciousness: alert  Complications: No apparent anesthesia complications

## 2012-04-04 NOTE — Op Note (Signed)
Dictation [phone]

## 2012-04-04 NOTE — Transfer of Care (Signed)
Immediate Anesthesia Transfer of Care Note  Patient: [name]  Procedure(s) Performed: Procedure(s) (LRB): OLECRANON BURSA (Left)  Patient Location: PACU  Anesthesia Type: MAC combined with regional for post-op pain  Level of Consciousness: awake, alert , oriented and patient cooperative  Airway & Oxygen Therapy: Patient Spontanous Breathing and Patient connected to face mask oxygen  Post-op Assessment: Report given to PACU RN and Post -op Vital signs reviewed and stable  Post vital signs: Reviewed and stable  Complications: No apparent anesthesia complications

## 2012-04-04 NOTE — Progress Notes (Signed)
Assisted Dr. Frederick with left, ultrasound guided, supraclavicular block. Side rails up, monitors on throughout procedure. See vital signs in flow sheet. Tolerated Procedure well. 

## 2012-04-04 NOTE — Anesthesia Preprocedure Evaluation (Signed)
Anesthesia Evaluation  Patient identified by MRN, date of birth, ID band Patient awake    Reviewed: Allergy & Precautions, H&P , NPO status , Patient's Chart, lab work & pertinent test results, reviewed documented beta blocker date and time   Airway Mallampati: II TM Distance: >3 FB Neck ROM: full    Dental   Pulmonary neg pulmonary ROS,          Cardiovascular hypertension, On Medications and On Home Beta Blockers + CAD + dysrhythmias     Neuro/Psych negative neurological ROS  negative psych ROS   GI/Hepatic negative GI ROS, Neg liver ROS,   Endo/Other  Diabetes mellitus-, Oral Hypoglycemic Agents  Renal/GU negative Renal ROS  negative genitourinary   Musculoskeletal   Abdominal   Peds  Hematology negative hematology ROS (+)   Anesthesia Other Findings See surgeon's H&P   Reproductive/Obstetrics negative OB ROS                           Anesthesia Physical Anesthesia Plan  ASA: III  Anesthesia Plan: MAC and Regional   Post-op Pain Management:    Induction: Intravenous  Airway Management Planned: Simple Face Mask  Additional Equipment:   Intra-op Plan:   Post-operative Plan: Extubation in OR  Informed Consent: I have reviewed the patients History and Physical, chart, labs and discussed the procedure including the risks, benefits and alternatives for the proposed anesthesia with the patient or authorized representative who has indicated his/her understanding and acceptance.   Dental Advisory Given  Plan Discussed with: CRNA and Surgeon  Anesthesia Plan Comments:         Anesthesia Quick Evaluation

## 2012-04-04 NOTE — H&P (Signed)
  [name] is an 78 y.o. male.   Chief Complaint: left elbow swelling HPI: 78 yo lhd male with swelling left posterior elbow x 5 days.  Seen in office on Friday and started on antibiotics.  No fevers, chills, night sweats.  No previous issues with left elbow.  Past Medical History  Diagnosis Date  . Hyperlipidemia   . Hypertension   . Arthritis   . Aortic stenosis   . Renal insufficiency   . Type 2 diabetes mellitus     borderline  . Anemia   . HOH (hard of hearing)     Past Surgical History  Procedure Date  . Back surgery   . Knee surgery     Left Knee  . Tonsillectomy   . Aortic valve replacement November 2010    No family history on file. Social History:  reports that he has never smoked. He does not have any smokeless tobacco history on file. He reports that he does not drink alcohol or use illicit drugs.  Allergies: No Known Allergies  No prescriptions prior to admission    No results found for this or any previous visit (from the past 48 hour(s)).  No results found.   A comprehensive review of systems was negative except for: Eyes: positive for contacts/glasses Ears, nose, mouth, throat, and face: positive for hearing loss Integument/breast: positive for rash Hematologic/lymphatic: positive for easy bruising  There were no vitals taken for this visit.  General appearance: alert, cooperative and appears stated age Head: Normocephalic, without obvious abnormality, atraumatic Neck: supple, symmetrical, trachea midline Resp: clear to auscultation bilaterally Cardio: regular rate and rhythm GI: soft, non-tender; bowel sounds normal; no masses,  no organomegaly Extremities: light touch sensation and capillary refill intact all digits.  left elbow with posterior swelling and mild erythema.  no streaking. Pulses: 2+ and symmetric Skin: as above Neurologic: Grossly normal Incision/Wound: As above  Assessment/Plan Left elbow infected bursa.  Discussed  nature of condition with patient.  Recommend OR for I&D of left elbow bursa.  Risks, benefits, and alternatives of surgery were discussed and the patient agrees with the plan of care.   Talor Cheema R 04/04/2012, 11:18 AM

## 2012-04-04 NOTE — Discharge Instructions (Addendum)
Call Dr. Merrilee Seashore office to schedule follow-up appointment for Friday.   [facility] Center Instructions Hand Surgery  Wound Care: Keep your hand elevated above the level of your heart.  Do not allow it to dangle  by your side.  Keep the dressing dry and do not remove it unless your doctor advises you to do so.  He will usually change it at the time of your post-op visit.  Moving your fingers is advised to stimulate circulation but will depend on the site of your surgery.  If you have a splint applied, your doctor will advise you regarding movement.  Activity: Do not drive or operate machinery today.  Rest today and then you may return to your normal activity and work as indicated by your physician.  Diet:  Drink liquids today or eat a light diet.  You may resume a regular diet tomorrow.    General expectations: Pain for two to three days. Fingers may become slightly swollen.  Call your doctor if any of the following occur: Severe pain not relieved by pain medication. Elevated temperature. Dressing soaked with blood. Inability to move fingers. White or bluish color to fingers.   Regional Anesthesia Blocks  1. Numbness or the inability to move the "blocked" extremity may last from 3-48 hours after placement. The length of time depends on the medication injected and your individual response to the medication. If the numbness is not going away after 48 hours, call your surgeon.  2. The extremity that is blocked will need to be protected until the numbness is gone and the  Strength has returned. Because you cannot feel it, you will need to take extra care to avoid injury. Because it may be weak, you may have difficulty moving it or using it. You may not know what position it is in without looking at it while the block is in effect.  3. For blocks in the legs and feet, returning to weight bearing and walking needs to be done carefully. You will need to wait until the numbness is entirely gone and  the strength has returned. You should be able to move your leg and foot normally before you try and bear weight or walk. You will need someone to be with you when you first try to ensure you do not fall and possibly risk injury.  4. Bruising and tenderness at the needle site are common side effects and will resolve in a few days.  5. Persistent numbness or new problems with movement should be communicated to the surgeon or the [facility] [phone] [facility] [phone]).    Post Anesthesia Home Care Instructions  Activity: Get plenty of rest for the remainder of the day. A responsible adult should stay with you for 24 hours following the procedure.  For the next 24 hours, DO NOT: -Drive a car -Advertising copywriter -Drink alcoholic beverages -Take any medication unless instructed by your physician -Make any legal decisions or sign important papers.  Meals: Start with liquid foods such as gelatin or soup. Progress to regular foods as tolerated. Avoid greasy, spicy, heavy foods. If nausea and/or vomiting occur, drink only clear liquids until the nausea and/or vomiting subsides. Call your physician if vomiting continues.  Special Instructions/Symptoms: Your throat may feel dry or sore from the anesthesia or the breathing tube placed in your throat during surgery. If this causes discomfort, gargle with warm salt water. The discomfort should disappear within 24 hours.

## 2012-04-05 ENCOUNTER — Encounter (HOSPITAL_BASED_OUTPATIENT_CLINIC_OR_DEPARTMENT_OTHER): Payer: Self-pay | Admitting: Orthopedic Surgery

## 2012-04-05 NOTE — Op Note (Signed)
NAMEARLEIGH, ODOWD NO.:  [id number]  MEDICAL RECORD NO.:  [id number]  LOCATION:                                 FACILITY:  PHYSICIAN:  Betha Loa, MD        DATE OF BIRTH:  1924/05/10  DATE OF PROCEDURE:  04/04/2012 DATE OF DISCHARGE:                              OPERATIVE REPORT   PREOPERATIVE DIAGNOSIS:  Left infected olecranon bursa.  POSTOPERATIVE DIAGNOSIS:  Left infected olecranon bursa.  PROCEDURE:  Irrigation, debridement and biopsy of left olecranon bursa and olecranon osteomyelitis.  SURGEON:  Betha Loa, MD  ASSISTANT:  None.  ANESTHESIA:  General regional with MAC.  IV FLUIDS:  Per anesthesia flow sheet.  ESTIMATED BLOOD LOSS:  Minimal.  COMPLICATIONS:  None.  SPECIMENS:  Cultures to micro.  Soft tissue and bone to pathology.  TOURNIQUET TIME:  24 minutes.  DISPOSITION:  Stable to PACU.  INDICATIONS:  Mr. Michalec is an 78 year old male who last week was in my office and was noted to have a swollen and erythematous mass on the dorsum of his left elbow.  He stated it been there for few days.  We discussed the nature of this.  I recommended going to the operating room for irrigation and debridement of infected olecranon bursa.  Risks, benefits, and alternatives of surgery were discussed including risk of blood loss; infection; damage to nerves, vessels, tendons, ligaments, bone; failure of surgery; need for additional surgery; complications with wound healing; continued pain; continued infection.  He voiced understanding of these risks and elected to proceed.  OPERATIVE COURSE:  After being identified preoperatively by myself, the patient and I agreed upon the procedure and site of procedure.  The surgical site was marked.  Risks, benefits, and alternatives of surgery were reviewed and he wished to proceed.  Surgical consent had been signed.  His preop antibiotics were held for cultures.  He was transferred to the operating room  and placed in the operating table in supine position.  A regional block had been performed by Anesthesia in preoperative holding.  Sedation was induced by the anesthesiologist. Left upper extremity was prepped and draped in normal sterile orthopedic fashion.  A surgical pause was performed between the surgeons, anesthesia, and operating staff, and all were in agreement as to the patient, procedure, and site of procedure.  Tourniquet at the proximal aspect of the extremity was inflated to 250 mmHg after exsanguination of the hand and forearm with an Esmarch bandage and gravity exsanguination of the remainder of the limb.  The swelling on the dorsum of the elbow had an open area of approximately 5 mm in diameter centrally.  This was sharply excised with the scalpel.  It was sent to pathology for examination to ensure there was no cancerous component.  There was some purulence within the subcutaneous tissues.  The incision was extended both proximally and distally.  Cultures were taken for aerobes, anaerobes, AFB, and fungus and sent to micro for examination.  The rongeurs were used to debride out necrotic tissue and purulence.  It was found that the infection coursed down to the tip of the olecranon. There were couple of devitalized pieces  of bone.  These were removed and sent to pathology for examination for evidence of osteomyelitis.  There was a small cyst on the olecranon.  A curette was used to clear this out.  It had good edges.  The wound was copiously irrigated with a 1000 mL of sterile saline by bulb syringe.  Care was taken to ensure removal of all devitalized tissue, which was the case.  The wound was then packed open with 0.25-inch iodoform gauze.  It was dressed with Xeroform, 4x4s, and wrapped with a Kerlix bandage.  A posterior splint was placed for comfort and wrapped with Kerlix and Ace bandage. Tourniquet was deflated to 24 minutes.  The fingertips were pink with brisk  capillary refill after deflation of the tourniquet.  Operative drapes were broken down.  The patient was awakened from anesthesia safely.  He was transferred back to stretcher and taken to PACU in stable condition.  I will give him Norco 5/325 1-2 p.o. q.6 hours p.r.n. pain, dispensed #30 and Bactrim DS 1 p.o. b.i.d. x7 days.  I will see him back in the office in 2-3 days for initiation of wound care.     Betha Loa, MD     KK/MEDQ  D:  04/04/2012  T:  04/05/2012  Job:  [id number]

## 2012-04-08 LAB — CULTURE, ROUTINE-ABSCESS

## 2012-04-10 LAB — ANAEROBIC CULTURE

## 2012-04-11 ENCOUNTER — Encounter (HOSPITAL_BASED_OUTPATIENT_CLINIC_OR_DEPARTMENT_OTHER): Payer: Self-pay

## 2012-05-02 LAB — FUNGUS CULTURE W SMEAR: Fungal Smear: NONE SEEN

## 2012-05-19 LAB — AFB CULTURE WITH SMEAR (NOT AT ARMC): Acid Fast Smear: NONE SEEN

## 2016-09-24 ENCOUNTER — Emergency Department (HOSPITAL_COMMUNITY)
Admission: EM | Admit: 2016-09-24 | Discharge: 2016-09-25 | Disposition: A | Discharge status: Home / Self Care | Payer: Medicare Other | Attending: Emergency Medicine | Admitting: Emergency Medicine

## 2016-09-24 ENCOUNTER — Encounter (HOSPITAL_COMMUNITY): Payer: Self-pay | Admitting: Emergency Medicine

## 2016-09-24 DIAGNOSIS — F918 Other conduct disorders: Secondary | ICD-10-CM | POA: Insufficient documentation

## 2016-09-24 DIAGNOSIS — E119 Type 2 diabetes mellitus without complications: Secondary | ICD-10-CM | POA: Insufficient documentation

## 2016-09-24 DIAGNOSIS — F0391 Unspecified dementia with behavioral disturbance: Secondary | ICD-10-CM

## 2016-09-24 DIAGNOSIS — F0281 Dementia in other diseases classified elsewhere with behavioral disturbance: Secondary | ICD-10-CM | POA: Diagnosis not present

## 2016-09-24 DIAGNOSIS — G309 Alzheimer's disease, unspecified: Secondary | ICD-10-CM | POA: Diagnosis not present

## 2016-09-24 DIAGNOSIS — R451 Restlessness and agitation: Secondary | ICD-10-CM | POA: Diagnosis present

## 2016-09-24 DIAGNOSIS — I10 Essential (primary) hypertension: Secondary | ICD-10-CM | POA: Insufficient documentation

## 2016-09-24 DIAGNOSIS — Z79899 Other long term (current) drug therapy: Secondary | ICD-10-CM | POA: Insufficient documentation

## 2016-09-24 DIAGNOSIS — Z7982 Long term (current) use of aspirin: Secondary | ICD-10-CM | POA: Insufficient documentation

## 2016-09-24 DIAGNOSIS — F4325 Adjustment disorder with mixed disturbance of emotions and conduct: Secondary | ICD-10-CM | POA: Diagnosis present

## 2016-09-24 DIAGNOSIS — I2510 Atherosclerotic heart disease of native coronary artery without angina pectoris: Secondary | ICD-10-CM | POA: Diagnosis not present

## 2016-09-24 DIAGNOSIS — R4689 Other symptoms and signs involving appearance and behavior: Secondary | ICD-10-CM

## 2016-09-24 DIAGNOSIS — R4181 Age-related cognitive decline: Secondary | ICD-10-CM | POA: Diagnosis present

## 2016-09-24 HISTORY — DX: Unspecified dementia, unspecified severity, without behavioral disturbance, psychotic disturbance, mood disturbance, and anxiety: F03.90

## 2016-09-24 LAB — COMPREHENSIVE METABOLIC PANEL
ALT: 13 U/L — AB (ref 17–63)
ANION GAP: 6 (ref 5–15)
AST: 22 U/L (ref 15–41)
Albumin: 3.9 g/dL (ref 3.5–5.0)
Alkaline Phosphatase: 69 U/L (ref 38–126)
BUN: 21 mg/dL — ABNORMAL HIGH (ref 6–20)
CHLORIDE: 108 mmol/L (ref 101–111)
CO2: 24 mmol/L (ref 22–32)
CREATININE: 1.9 mg/dL — AB (ref 0.61–1.24)
Calcium: 8.9 mg/dL (ref 8.9–10.3)
GFR, EST AFRICAN AMERICAN: 34 mL/min — AB (ref >60)
GFR, EST NON AFRICAN AMERICAN: 29 mL/min — AB (ref >60)
Glucose, Bld: 94 mg/dL (ref 65–99)
POTASSIUM: 3.6 mmol/L (ref 3.5–5.1)
Sodium: 138 mmol/L (ref 135–145)
Total Bilirubin: 0.6 mg/dL (ref 0.3–1.2)
Total Protein: 6.8 g/dL (ref 6.5–8.1)

## 2016-09-24 LAB — CBC WITH DIFFERENTIAL/PLATELET
Basophils Absolute: 0 10*3/uL (ref 0.0–0.1)
Basophils Relative: 0 %
EOS PCT: 3 %
Eosinophils Absolute: 0.2 10*3/uL (ref 0.0–0.7)
HCT: 33.6 % — ABNORMAL LOW (ref 39.0–52.0)
Hemoglobin: 11.5 g/dL — ABNORMAL LOW (ref 13.0–17.0)
LYMPHS ABS: 1.3 10*3/uL (ref 0.7–4.0)
LYMPHS PCT: 17 %
MCH: 30 pg (ref 26.0–34.0)
MCHC: 34.2 g/dL (ref 30.0–36.0)
MCV: 87.7 fL (ref 78.0–100.0)
MONO ABS: 0.8 10*3/uL (ref 0.1–1.0)
MONOS PCT: 11 %
Neutro Abs: 5.1 10*3/uL (ref 1.7–7.7)
Neutrophils Relative %: 69 %
PLATELETS: 187 10*3/uL (ref 150–400)
RBC: 3.83 MIL/uL — ABNORMAL LOW (ref 4.22–5.81)
RDW: 13.8 % (ref 11.5–15.5)
WBC: 7.4 10*3/uL (ref 4.0–10.5)

## 2016-09-24 LAB — URINALYSIS, ROUTINE W REFLEX MICROSCOPIC
BILIRUBIN URINE: NEGATIVE
Glucose, UA: NEGATIVE mg/dL
Ketones, ur: NEGATIVE mg/dL
Leukocytes, UA: NEGATIVE
NITRITE: NEGATIVE
Protein, ur: 100 mg/dL — AB
SPECIFIC GRAVITY, URINE: 1.012 (ref 1.005–1.030)
pH: 6 (ref 5.0–8.0)

## 2016-09-24 LAB — RAPID URINE DRUG SCREEN, HOSP PERFORMED
Amphetamines: NOT DETECTED
BENZODIAZEPINES: NOT DETECTED
Barbiturates: NOT DETECTED
COCAINE: NOT DETECTED
Opiates: NOT DETECTED
Tetrahydrocannabinol: NOT DETECTED

## 2016-09-24 LAB — URINE MICROSCOPIC-ADD ON: BACTERIA UA: NONE SEEN

## 2016-09-24 LAB — ETHANOL

## 2016-09-24 MED ORDER — AMLODIPINE BESYLATE 5 MG PO TABS
5.0000 mg | ORAL_TABLET | Freq: Every evening | ORAL | Status: DC | PRN
  Administered 2016-09-25: 5 mg via ORAL
  Filled 2016-09-24: qty 1

## 2016-09-24 MED ORDER — METOPROLOL TARTRATE 25 MG PO TABS
25.0000 mg | ORAL_TABLET | Freq: Once | ORAL | Status: AC
  Administered 2016-09-24: 25 mg via ORAL
  Filled 2016-09-24: qty 1

## 2016-09-24 NOTE — ED Notes (Signed)
Patient's son in law took patient's clothes home-patient did not want the visitor to stay

## 2016-09-24 NOTE — BH Assessment (Signed)
[facility] Assessment Progress Note    Case was staffed with Hobson FNP who recommended an inpatient admission as appropriate bed placement is investigated.   

## 2016-09-24 NOTE — ED Notes (Signed)
Bed: [id number] Expected date:  Expected time:  Means of arrival:  Comments: [employer]- Medical clearance

## 2016-09-24 NOTE — ED Notes (Signed)
This nurse attempted to reattach pt's identification bracelet; pt grabbed bracelet and rubbed it on his buttocks.

## 2016-09-24 NOTE — ED Provider Notes (Addendum)
[facility]-EMERGENCY DEPT Provider Note   CSN: [id number] Arrival date & time: 09/24/16  1129     History   Chief Complaint Chief Complaint  Patient presents with  . Medical Clearance    HPI [name] is a 82 y.o. male.  He presents here accompanied by family and police. He has a history of dementia. Apparently became agitated today at home to the point signed out of the take care of him became concerned about his level of anger and presented here. They were driving with him. He wanted to go to the bank. He was told it was closed [place] of [place] Became agitated but did not strike or harm anyone. Family saw police and asked for help and he was brought in by police for evaluation.  Currently his son/daughter have power of attorney for him. He was previously living in his own home before he became incapable of doing so because of his dementia and now lives with them. He states he feels his family is "trying to take everything from me".  HPI  Past Medical History:  Diagnosis Date  . Anemia   . Aortic stenosis   . Arthritis   . Dementia   . HOH (hard of hearing)   . Hyperlipidemia   . Hypertension   . Renal insufficiency   . Type 2 diabetes mellitus (HCC)    borderline    Patient Active Problem List   Diagnosis Date Noted  . AORTIC VALVE REPLACEMENT, HX OF 01/15/2010  . ATRIAL FIBRILLATION 10/07/2009  . RENAL INSUFFICIENCY 08/14/2009  . CONGENITAL FACTOR VIII DISORDER 08/07/2009  . ACUTE RHEUMATIC ENDOCARDITIS 08/07/2009  . CORONARY ATHEROSCLEROSIS NATIVE CORONARY ARTERY 08/07/2009  . DM 06/10/2009  . HYPERCHOLESTEROLEMIA, PURE 11/20/2008  . HYPERTENSION, BENIGN 11/20/2008  . AORTIC STENOSIS/ INSUFFICIENCY, NON-RHEUMATIC 11/20/2008    Past Surgical History:  Procedure Laterality Date  . AORTIC VALVE REPLACEMENT  November 2010  . BACK SURGERY    . KNEE SURGERY     Left Knee  . OLECRANON BURSECTOMY  04/04/2012   Procedure: OLECRANON BURSA;  Surgeon:  Tami RibasKevin R Kuzma, MD;  Location: [facility];  Service: Orthopedics;  Laterality: Left;  left elbow bursa I&D  . TONSILLECTOMY         Home Medications    Prior to Admission medications   Medication Sig Start Date End Date Taking? Authorizing Provider  aspirin 81 MG tablet Take 81 mg by mouth daily.    Yes Historical Provider, MD  donepezil (ARICEPT) 10 MG tablet Take 10 mg by mouth at bedtime.   Yes Historical Provider, MD  Melatonin 10 MG TABS Take 1 tablet by mouth at bedtime.   Yes Historical Provider, MD  memantine (NAMENDA) 10 MG tablet Take 10 mg by mouth at bedtime.   Yes Historical Provider, MD  metoprolol tartrate (LOPRESSOR) 25 MG tablet TAKE 1 TABLET BY MOUTH EVERY DAY 02/12/12  Yes Lewayne BuntingBrian S Crenshaw, MD  amLODipine (NORVASC) 10 MG tablet Take 10 mg by mouth at bedtime.    Historical Provider, MD  HYDROcodone-acetaminophen [facility](NORCO) 5-325 MG per tablet 1-2 tabs po q6 hours prn pain Patient not taking: Reported on 09/24/2016 04/04/12   Betha LoaKevin Kuzma, MD  pravastatin (PRAVACHOL) 40 MG tablet Take 40 mg by mouth at bedtime.    Historical Provider, MD  sulfamethoxazole-trimethoprim (BACTRIM DS) 800-160 MG per tablet Take 1 tablet by mouth 2 (two) times daily.    Historical Provider, MD    Family History  No family history on file.  Social History Social History  Substance Use Topics  . Smoking status: Never Smoker  . Smokeless tobacco: Not on file  . Alcohol use No     Allergies   Patient has no known allergies.   Review of Systems Review of Systems  Unable to perform ROS: Dementia     Physical Exam Updated Vital Signs BP 184/75 (BP Location: Left Arm)   Pulse 75   Temp 98 F (36.7 C) (Oral)   Resp 18   SpO2 99%   Physical Exam  Constitutional: He appears well-developed and well-nourished. No distress.  HENT:  Head: Normocephalic.  Eyes: Conjunctivae are normal. Pupils are equal, round, and reactive to light. No scleral icterus.  Neck: Normal  range of motion. Neck supple. No thyromegaly present.  Cardiovascular: Normal rate and regular rhythm.  Exam reveals no gallop and no friction rub.   No murmur heard. Pulmonary/Chest: Effort normal and breath sounds normal. No respiratory distress. He has no wheezes. He has no rales.  Abdominal: Soft. Bowel sounds are normal. He exhibits no distension. There is no tenderness. There is no rebound.  Musculoskeletal: Normal range of motion.  Neurological: He is alert.  Skin: Skin is warm and dry. No rash noted.  Psychiatric: He has a normal mood and affect. His behavior is normal.   Easily angered.  ED Treatments / Results  Labs (all labs ordered are listed, but only abnormal results are displayed) Labs Reviewed  CBC WITH DIFFERENTIAL/PLATELET - Abnormal; Notable for the following:       Result Value   RBC 3.83 (*)    Hemoglobin 11.5 (*)    HCT 33.6 (*)    All other components within normal limits  COMPREHENSIVE METABOLIC PANEL - Abnormal; Notable for the following:    BUN 21 (*)    Creatinine, Ser 1.90 (*)    ALT 13 (*)    GFR calc non Af Amer 29 (*)    GFR calc Af Amer 34 (*)    All other components within normal limits  ETHANOL  RAPID URINE DRUG SCREEN, HOSP PERFORMED  URINALYSIS, ROUTINE W REFLEX MICROSCOPIC (NOT [facility])    EKG  EKG Interpretation None       Radiology No results found.  Procedures Procedures (including critical care time)  Medications Ordered in ED Medications - No data to display   Initial Impression / Assessment and Plan / ED Course  I have reviewed the triage vital signs and the nursing notes.  Pertinent labs & imaging results that were available during my care of the patient were reviewed by me and considered in my medical decision making (see chart for details).  Clinical Course     Patient will undergo TTS evaluation. May need medications for behavior control. Emotional lability is very likely secondary to his advancing  dementia.  Cutting 1.9. No comparison. She 7.4. Hemoglobin 11.9. Alcohol negative. Patient medically clear pending urinalysis and urine toxicology  Final Clinical Impressions(s) / ED Diagnoses   Final diagnoses:  Alzheimer's dementia with behavioral disturbance, unspecified timing of dementia onset   16:17. Awaiting placement. Patient is currently medically clear.  New Prescriptions New Prescriptions   No medications on file     Rolland PorterMark Jovanie Verge, MD 09/24/16 [phone]    Rolland PorterMark Vienna Folden, MD 09/24/16 1523    Rolland PorterMark Ammi Hutt, MD 09/24/16 [phone]

## 2016-09-24 NOTE — ED Notes (Signed)
Nurse is aware of bp

## 2016-09-24 NOTE — ED Notes (Signed)
MD Rhunette CroftNanavati made aware of elevated BP
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
## 2016-09-24 NOTE — ED Notes (Signed)
Ginger Duke-daughter [phone]

## 2016-09-24 NOTE — ED Notes (Signed)
Per Dr. Rhunette CroftNanavati, will reassess BP-will review home BP meds

## 2016-09-25 ENCOUNTER — Encounter (HOSPITAL_COMMUNITY): Payer: Self-pay | Admitting: Registered Nurse

## 2016-09-25 DIAGNOSIS — F918 Other conduct disorders: Secondary | ICD-10-CM | POA: Diagnosis not present

## 2016-09-25 DIAGNOSIS — R451 Restlessness and agitation: Secondary | ICD-10-CM | POA: Diagnosis present

## 2016-09-25 DIAGNOSIS — E119 Type 2 diabetes mellitus without complications: Secondary | ICD-10-CM | POA: Diagnosis not present

## 2016-09-25 DIAGNOSIS — Z79899 Other long term (current) drug therapy: Secondary | ICD-10-CM

## 2016-09-25 DIAGNOSIS — R4181 Age-related cognitive decline: Secondary | ICD-10-CM | POA: Diagnosis present

## 2016-09-25 DIAGNOSIS — I10 Essential (primary) hypertension: Secondary | ICD-10-CM | POA: Diagnosis not present

## 2016-09-25 DIAGNOSIS — Z7982 Long term (current) use of aspirin: Secondary | ICD-10-CM | POA: Diagnosis not present

## 2016-09-25 DIAGNOSIS — F4325 Adjustment disorder with mixed disturbance of emotions and conduct: Secondary | ICD-10-CM

## 2016-09-25 DIAGNOSIS — G309 Alzheimer's disease, unspecified: Secondary | ICD-10-CM | POA: Diagnosis not present

## 2016-09-25 DIAGNOSIS — I2510 Atherosclerotic heart disease of native coronary artery without angina pectoris: Secondary | ICD-10-CM | POA: Diagnosis not present

## 2016-09-25 DIAGNOSIS — F0281 Dementia in other diseases classified elsewhere with behavioral disturbance: Secondary | ICD-10-CM | POA: Diagnosis not present

## 2016-09-25 LAB — CBG MONITORING, ED: GLUCOSE-CAPILLARY: 142 mg/dL — AB (ref 65–99)

## 2016-09-25 NOTE — ED Provider Notes (Addendum)
Psychiatry evaluation noted. Patient does not meet criteria for inpatient placement. Behavioral health staff attempting to notify family.. Patient medically clear. Appropriate for discharge.   Rolland PorterMark Bren Borys, MD 09/25/16 1437    Rolland PorterMark Girlie Veltri, MD 09/25/16 [phone]

## 2016-09-25 NOTE — ED Notes (Signed)
Pt resting will continue to monitor.  

## 2016-09-25 NOTE — ED Notes (Signed)
Pt discharged to home. DC instructions given to daughter, Arline AspCindy, who is transporting patient to home. Pt left unit in wheelchair pushed by nurse tech accompanied by daughter. Left in stable condition. VWilliams,rn.

## 2016-09-25 NOTE — BH Assessment (Signed)
[facility] Assessment Progress Note This Clinical research associatewriter contacted daughter Chester HolsteinGinger Duke [phone] to inform that patient has been cleared by psychiatry and is appropriate for discharge. Message was left at that number. Status pending.

## 2016-09-25 NOTE — ED Notes (Signed)
Pt ate 100%  Of is lunch

## 2016-09-25 NOTE — ED Notes (Signed)
Pt ate 75% of his breakfast.

## 2016-09-25 NOTE — BH Assessment (Signed)
[facility] Assessment Progress Note   Clinician referred patient out to [place], [facility] Rushie Goltz([place]) and [place], [place]

## 2016-09-25 NOTE — Discharge Instructions (Signed)
Follow-up with Dr. Russo as needed. °

## 2016-09-25 NOTE — BH Assessment (Signed)
[facility] Assessment Progress Note This writer attempted to contact patient's daughter this date to discuss patient's care plan. Ginger Duke [phone] daughter could not be contacted at that number.
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
## 2016-09-25 NOTE — Progress Notes (Signed)
CSW received phone call from patient's RN informing CSW that patient's daughter was present. CSW spoke with patient's daughter regarding patient's discharge. Patient's daughter expressed concern about patient's recent behaviors. CSW explained to patient's daughter that patient had been psychiatrically cleared. Patient's daughter reported that her sister informed her that the patient could not be left alone and that she has to go to work. TTS spoke with patient's daughter outside of the room regarding her concerns about the patient. CSW explained resources to patient. Patient reviewed resources. CSW asked patient's daughter if she was interested in resources for local facilities that could provide care for patient, patient's daughter replied yes. CSW provided patient's daughter with resources for local nursing facilities and information on local memory care units. CSW explained resources to patient's daughter. CSW inquired if patient and or patient's daughter had any additional questions or concerns, they replied no. Patient's daughter informed patient that he was going home with her, he replied ok.

## 2017-04-10 ENCOUNTER — Encounter (HOSPITAL_COMMUNITY): Payer: Self-pay

## 2017-04-10 ENCOUNTER — Emergency Department (HOSPITAL_COMMUNITY)
Admission: EM | Admit: 2017-04-10 | Discharge: 2017-04-10 | Disposition: A | Discharge status: Home / Self Care | Payer: Medicare Other | Attending: Emergency Medicine | Admitting: Emergency Medicine

## 2017-04-10 DIAGNOSIS — R404 Transient alteration of awareness: Secondary | ICD-10-CM | POA: Insufficient documentation

## 2017-04-10 DIAGNOSIS — I2510 Atherosclerotic heart disease of native coronary artery without angina pectoris: Secondary | ICD-10-CM | POA: Diagnosis not present

## 2017-04-10 DIAGNOSIS — Z7982 Long term (current) use of aspirin: Secondary | ICD-10-CM | POA: Diagnosis not present

## 2017-04-10 DIAGNOSIS — Z79899 Other long term (current) drug therapy: Secondary | ICD-10-CM | POA: Insufficient documentation

## 2017-04-10 DIAGNOSIS — I119 Hypertensive heart disease without heart failure: Secondary | ICD-10-CM | POA: Diagnosis not present

## 2017-04-10 DIAGNOSIS — E119 Type 2 diabetes mellitus without complications: Secondary | ICD-10-CM | POA: Insufficient documentation

## 2017-04-10 DIAGNOSIS — R627 Adult failure to thrive: Secondary | ICD-10-CM | POA: Diagnosis present

## 2017-04-10 LAB — CBC WITH DIFFERENTIAL/PLATELET
Basophils Absolute: 0 10*3/uL (ref 0.0–0.1)
Basophils Relative: 0 %
EOS PCT: 8 %
Eosinophils Absolute: 0.5 10*3/uL (ref 0.0–0.7)
HEMATOCRIT: 35 % — AB (ref 39.0–52.0)
Hemoglobin: 11.4 g/dL — ABNORMAL LOW (ref 13.0–17.0)
LYMPHS ABS: 1.6 10*3/uL (ref 0.7–4.0)
LYMPHS PCT: 24 %
MCH: 29.7 pg (ref 26.0–34.0)
MCHC: 32.6 g/dL (ref 30.0–36.0)
MCV: 91.1 fL (ref 78.0–100.0)
MONO ABS: 0.9 10*3/uL (ref 0.1–1.0)
Monocytes Relative: 13 %
NEUTROS ABS: 3.7 10*3/uL (ref 1.7–7.7)
Neutrophils Relative %: 55 %
PLATELETS: 168 10*3/uL (ref 150–400)
RBC: 3.84 MIL/uL — AB (ref 4.22–5.81)
RDW: 13.8 % (ref 11.5–15.5)
WBC: 6.7 10*3/uL (ref 4.0–10.5)

## 2017-04-10 LAB — URINALYSIS, ROUTINE W REFLEX MICROSCOPIC
Bilirubin Urine: NEGATIVE
GLUCOSE, UA: NEGATIVE mg/dL
HGB URINE DIPSTICK: NEGATIVE
Ketones, ur: NEGATIVE mg/dL
LEUKOCYTES UA: NEGATIVE
Nitrite: NEGATIVE
PH: 5 (ref 5.0–8.0)
PROTEIN: NEGATIVE mg/dL
Specific Gravity, Urine: 1.01 (ref 1.005–1.030)

## 2017-04-10 LAB — COMPREHENSIVE METABOLIC PANEL
ALBUMIN: 3.5 g/dL (ref 3.5–5.0)
ALT: 19 U/L (ref 17–63)
AST: 21 U/L (ref 15–41)
Alkaline Phosphatase: 71 U/L (ref 38–126)
Anion gap: 6 (ref 5–15)
BUN: 26 mg/dL — AB (ref 6–20)
CHLORIDE: 106 mmol/L (ref 101–111)
CO2: 23 mmol/L (ref 22–32)
CREATININE: 1.98 mg/dL — AB (ref 0.61–1.24)
Calcium: 9.2 mg/dL (ref 8.9–10.3)
GFR calc Af Amer: 32 mL/min — ABNORMAL LOW (ref >60)
GFR calc non Af Amer: 28 mL/min — ABNORMAL LOW (ref >60)
Glucose, Bld: 110 mg/dL — ABNORMAL HIGH (ref 65–99)
Potassium: 4.4 mmol/L (ref 3.5–5.1)
SODIUM: 135 mmol/L (ref 135–145)
Total Bilirubin: 0.4 mg/dL (ref 0.3–1.2)
Total Protein: 5.9 g/dL — ABNORMAL LOW (ref 6.5–8.1)

## 2017-04-10 LAB — CBG MONITORING, ED: Glucose-Capillary: 90 mg/dL (ref 65–99)

## 2017-04-10 NOTE — ED Notes (Signed)
[facility] called by secretary to transport pt home.

## 2017-04-10 NOTE — ED Notes (Signed)
Yellow fall wristband and yellow socks placed on pt. Fall mat placed at the base of pt's bed. Pt demonstrated proper use of call light and verbalized he would use the call light if he needed to get up or needed help.

## 2017-04-10 NOTE — ED Provider Notes (Signed)
[facility]-EMERGENCY DEPT Provider Note   CSN: [id number] Arrival date & time: 04/10/17  1505     History   Chief Complaint Chief Complaint  Patient presents with  . Failure To Thrive    HPI [name] is a 83 y.o. male.  HPI   Presents with concern for episode of altered consciousness at the facility.  Missed lunch today because he was sleeping, at 2PM went to check on him.  He was sleeping, and they went to wake him and found him to be less responsive than normal after waking.  He was not unconscious, and would grunt to their questions, however was not waking up typically for him, not opening his eyes, not alert. His HR was in 50s.  They called EMS and on EMS arrival, he became more alert, stood up and walked and was at baseline.    Patient at this time denies any symptoms. Reports he is here because they sent him but not sure why.  He denies chest pain, shortness of breath, abdominal pain, fevers, cough, headache.  Facility denies any concern for other acute symptoms. No focal neurologic deficits.   Past Medical History:  Diagnosis Date  . Anemia   . Aortic stenosis   . Arthritis   . Dementia   . HOH (hard of hearing)   . Hyperlipidemia   . Hypertension   . Renal insufficiency   . Type 2 diabetes mellitus (HCC)    borderline    Patient Active Problem List   Diagnosis Date Noted  . Age-related cognitive decline 09/25/2016  . 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 AORTIC VALVE REPLACEMENT, HX OF 01/15/2010  . ATRIAL FIBRILLATION 10/07/2009  . RENAL INSUFFICIENCY 08/14/2009  . CONGENITAL FACTOR VIII DISORDER 08/07/2009  . ACUTE RHEUMATIC ENDOCARDITIS 08/07/2009  . CORONARY ATHEROSCLEROSIS NATIVE CORONARY ARTERY 08/07/2009  . DM 06/10/2009  . HYPERCHOLESTEROLEMIA, PURE 11/20/2008  . HYPERTENSION, BENIGN 11/20/2008  . AORTIC STENOSIS/ INSUFFICIENCY, NON-RHEUMATIC 11/20/2008    Past Surgical History:  Procedure Laterality  Date  . AORTIC VALVE REPLACEMENT  November 2010  . BACK SURGERY    . KNEE SURGERY     Left Knee  . OLECRANON BURSECTOMY  04/04/2012   Procedure: OLECRANON BURSA;  Surgeon: Tami RibasKevin R Kuzma, MD;  Location: [facility];  Service: Orthopedics;  Laterality: Left;  left elbow bursa I&D  . TONSILLECTOMY         Home Medications    Prior to Admission medications   Medication Sig Start Date End Date Taking? Authorizing Provider  amLODipine (NORVASC) 10 MG tablet Take 10 mg by mouth at bedtime.    [provider]  aspirin 81 MG tablet Take 81 mg by mouth daily.     [provider]  donepezil (ARICEPT) 10 MG tablet Take 10 mg by mouth at bedtime.    [provider]  HYDROcodone-acetaminophen (NORCO) 5-325 MG per tablet 1-2 tabs po q6 hours prn pain Patient not taking: Reported on 09/24/2016 04/04/12   Betha LoaKuzma, Kevin, MD  Melatonin 10 MG TABS Take 1 tablet by mouth at bedtime.    [provider]  memantine (NAMENDA) 10 MG tablet Take 10 mg by mouth at bedtime.    [provider]  metoprolol tartrate (LOPRESSOR) 25 MG tablet TAKE 1 TABLET BY MOUTH EVERY DAY 02/12/12   Lewayne Buntingrenshaw, Brian S, MD  pravastatin (PRAVACHOL) 40 MG tablet Take 40 mg by mouth at bedtime.    [provider]  sulfamethoxazole-trimethoprim (BACTRIM DS) 800-160 MG per tablet Take 1 tablet by mouth 2 (two) times daily.    [provider]    Family History No family history on file.  Social History Social History  Substance Use Topics  . Smoking status: Never Smoker  . Smokeless tobacco: Not on file  . Alcohol use No     Allergies   Patient has no known allergies.   Review of Systems Review of Systems  Constitutional: Negative for fever.  HENT: Negative for sore throat.   Eyes: Negative for visual disturbance.  Respiratory: Negative for shortness of breath.   Cardiovascular: Negative for chest pain.  Gastrointestinal: Negative for abdominal  pain, diarrhea, nausea and vomiting.  Genitourinary: Negative for difficulty urinating and dysuria.  Musculoskeletal: Negative for back pain and neck stiffness.  Skin: Negative for rash.  Neurological: Negative for syncope, weakness, numbness and headaches.     Physical Exam Updated Vital Signs BP (!) 127/45   Pulse (!) 52   Temp (S) 97.5 F (36.4 C) (Oral)   Resp 10   Ht 5\' 7"  (1.702 m)   Wt 81.6 kg (180 lb)   SpO2 97%   BMI 28.19 kg/m   Physical Exam  Constitutional: He is oriented to person, place, and time. He appears well-developed and well-nourished. No distress.  HENT:  Head: Normocephalic and atraumatic.  Eyes: Conjunctivae and EOM are normal.  Neck: Normal range of motion.  Cardiovascular: Normal rate, regular rhythm, normal heart sounds and intact distal pulses.  Exam reveals no gallop and no friction rub.   No murmur heard. Pulmonary/Chest: Effort normal and breath sounds normal. No respiratory distress. He has no wheezes. He has no rales.  Abdominal: Soft. He exhibits no distension. There is no tenderness. There is no guarding.  Musculoskeletal: He exhibits edema (2+bilateral).  Neurological: He is alert and oriented to person, place, and time.  Skin: Skin is warm and dry. He is not diaphoretic.  Nursing note and vitals reviewed.    ED Treatments / Results  Labs (all labs ordered are listed, but only abnormal results are displayed) Labs Reviewed  COMPREHENSIVE METABOLIC PANEL - Abnormal; Notable for the following:       Result Value   Glucose, Bld 110 (*)    BUN 26 (*)    Creatinine, Ser 1.98 (*)    Total Protein 5.9 (*)    GFR calc non Af Amer 28 (*)    GFR calc Af Amer 32 (*)    All other components within normal limits  CBC WITH DIFFERENTIAL/PLATELET - Abnormal; Notable for the following:    RBC 3.84 (*)    Hemoglobin 11.4 (*)    HCT 35.0 (*)    All other components within normal limits  URINE CULTURE  URINALYSIS, ROUTINE W REFLEX MICROSCOPIC    CBG MONITORING, ED    EKG  EKG Interpretation  Date/Time:  Sunday Apr 10 2017 15:13:23 EDT Ventricular Rate:  56 PR Interval:    QRS Duration: 130 QT Interval:  482 QTC Calculation: 466 R Axis:   -24 Text Interpretation:  Normal sinus rhythm Left bundle branch block No significant change since last tracing Confirmed by [facility] MD, Denny Peon ([id number]) on 04/10/2017 4:27:02 PM       Radiology No results found.  Procedures Procedures (including critical care time)  Medications Ordered in ED Medications - No data to display   Initial Impression / Assessment and Plan / ED Course  I have reviewed the  triage vital signs and the nursing notes.  Pertinent labs & imaging results that were available during my care of the patient were reviewed by me and considered in my medical decision making (see chart for details).     83 year old male with history of hypertension, hyperlipidemia, DM, dementia, hard of hearing who presents with concern for sleeping through lunch with episode of altered consciousness today after being awoken from nap.  Patient asymptomatic at this time.  Normal neurologic exam with excellent strength. No hx to suggest seizure. No hx to suggest head trauma. Sinus rhythm on the monitor, HR in 50s with normal blood pressures WNL, and feel HR appropriate given pt metoprolol use.  No signs of CVA. Labs without significant abnormalities. No chest pain or dyspnea to suggest ACS.  No signs of sepsis. No sign of UTI.   Patient well appearing without concerns at this time. Feel outpatient monitoring of symptoms is appropriate at this time.   Final Clinical Impressions(s) / ED Diagnoses   Final diagnoses:  Episode of altered consciousness    New Prescriptions New Prescriptions   No medications on file     Alvira Monday, MD 04/10/17 1721

## 2017-04-10 NOTE — ED Triage Notes (Signed)
Pt with hx of dementia and failure to thrive [facility]. Per EMS, facility called due to "pt not being himself" and appearing lethargic. On arrival, pt lethargic able to follow commands with a lot of encouragement. Per EMS, when pt began to walk to stretcher to be transported to hospital, pt became alert, was able to follow commands without difficulty, and lethargy seemed to resolve completely. Per EMS, no neuro deficits noted, VS WDL. Pt alert to self, situation, and place. HR 48, on metoprolol.

## 2017-04-10 NOTE — ED Notes (Signed)
ED Provider at bedside. 

## 2017-04-10 NOTE — ED Notes (Signed)
D/c instructions given to [facility].

## 2017-04-10 NOTE — ED Notes (Signed)
Spoke with pt daughter and listed contact person, Ginger Duke. Per Ginger, pt had a similar episode yesterday. Per pt's daughter, pt sounds to be at his baseline. This nurse encouraged pt daughter to visit her father in the hospital to ensure he is at his baseline. Dr. Dalene SeltzerSchlossman updated.

## 2017-04-11 LAB — URINE CULTURE

## 2017-07-26 ENCOUNTER — Encounter (HOSPITAL_COMMUNITY): Payer: Self-pay | Admitting: Emergency Medicine

## 2017-07-26 ENCOUNTER — Emergency Department (HOSPITAL_COMMUNITY)
Admission: EM | Admit: 2017-07-26 | Discharge: 2017-07-28 | Disposition: A | Discharge status: Home / Self Care | Payer: Medicare Other | Attending: Emergency Medicine | Admitting: Emergency Medicine

## 2017-07-26 DIAGNOSIS — E119 Type 2 diabetes mellitus without complications: Secondary | ICD-10-CM | POA: Diagnosis not present

## 2017-07-26 DIAGNOSIS — Z79899 Other long term (current) drug therapy: Secondary | ICD-10-CM | POA: Diagnosis not present

## 2017-07-26 DIAGNOSIS — R2243 Localized swelling, mass and lump, lower limb, bilateral: Secondary | ICD-10-CM | POA: Insufficient documentation

## 2017-07-26 DIAGNOSIS — F0391 Unspecified dementia with behavioral disturbance: Secondary | ICD-10-CM | POA: Insufficient documentation

## 2017-07-26 DIAGNOSIS — I2510 Atherosclerotic heart disease of native coronary artery without angina pectoris: Secondary | ICD-10-CM | POA: Diagnosis not present

## 2017-07-26 DIAGNOSIS — Z7982 Long term (current) use of aspirin: Secondary | ICD-10-CM | POA: Diagnosis not present

## 2017-07-26 DIAGNOSIS — R451 Restlessness and agitation: Secondary | ICD-10-CM | POA: Diagnosis present

## 2017-07-26 DIAGNOSIS — F0390 Unspecified dementia without behavioral disturbance: Secondary | ICD-10-CM | POA: Diagnosis present

## 2017-07-26 DIAGNOSIS — Z751 Person awaiting admission to adequate facility elsewhere: Secondary | ICD-10-CM

## 2017-07-26 DIAGNOSIS — I10 Essential (primary) hypertension: Secondary | ICD-10-CM | POA: Diagnosis not present

## 2017-07-26 DIAGNOSIS — R413 Other amnesia: Secondary | ICD-10-CM | POA: Diagnosis not present

## 2017-07-26 DIAGNOSIS — R454 Irritability and anger: Secondary | ICD-10-CM | POA: Diagnosis not present

## 2017-07-26 LAB — COMPREHENSIVE METABOLIC PANEL
ALBUMIN: 3.7 g/dL (ref 3.5–5.0)
ALT: 12 U/L — ABNORMAL LOW (ref 17–63)
AST: 19 U/L (ref 15–41)
Alkaline Phosphatase: 84 U/L (ref 38–126)
Anion gap: 8 (ref 5–15)
BUN: 33 mg/dL — AB (ref 6–20)
CHLORIDE: 107 mmol/L (ref 101–111)
CO2: 22 mmol/L (ref 22–32)
Calcium: 8.9 mg/dL (ref 8.9–10.3)
Creatinine, Ser: 2.12 mg/dL — ABNORMAL HIGH (ref 0.61–1.24)
GFR calc Af Amer: 30 mL/min — ABNORMAL LOW (ref >60)
GFR calc non Af Amer: 26 mL/min — ABNORMAL LOW (ref >60)
Glucose, Bld: 156 mg/dL — ABNORMAL HIGH (ref 65–99)
Potassium: 4.3 mmol/L (ref 3.5–5.1)
SODIUM: 137 mmol/L (ref 135–145)
Total Bilirubin: 0.5 mg/dL (ref 0.3–1.2)
Total Protein: 5.8 g/dL — ABNORMAL LOW (ref 6.5–8.1)

## 2017-07-26 LAB — CBC WITH DIFFERENTIAL/PLATELET
Basophils Absolute: 0 10*3/uL (ref 0.0–0.1)
Basophils Relative: 0 %
Eosinophils Absolute: 0.5 10*3/uL (ref 0.0–0.7)
Eosinophils Relative: 10 %
HEMATOCRIT: 29.7 % — AB (ref 39.0–52.0)
HEMOGLOBIN: 9.8 g/dL — AB (ref 13.0–17.0)
LYMPHS ABS: 1.5 10*3/uL (ref 0.7–4.0)
LYMPHS PCT: 29 %
MCH: 30.3 pg (ref 26.0–34.0)
MCHC: 33 g/dL (ref 30.0–36.0)
MCV: 92 fL (ref 78.0–100.0)
MONO ABS: 0.6 10*3/uL (ref 0.1–1.0)
MONOS PCT: 12 %
NEUTROS ABS: 2.4 10*3/uL (ref 1.7–7.7)
NEUTROS PCT: 49 %
Platelets: 158 10*3/uL (ref 150–400)
RBC: 3.23 MIL/uL — ABNORMAL LOW (ref 4.22–5.81)
RDW: 14.3 % (ref 11.5–15.5)
WBC: 5 10*3/uL (ref 4.0–10.5)

## 2017-07-26 LAB — URINALYSIS, ROUTINE W REFLEX MICROSCOPIC
BACTERIA UA: NONE SEEN
Bilirubin Urine: NEGATIVE
Glucose, UA: NEGATIVE mg/dL
Hgb urine dipstick: NEGATIVE
Ketones, ur: NEGATIVE mg/dL
Nitrite: NEGATIVE
PH: 5 (ref 5.0–8.0)
Protein, ur: NEGATIVE mg/dL
SPECIFIC GRAVITY, URINE: 1.011 (ref 1.005–1.030)
SQUAMOUS EPITHELIAL / LPF: NONE SEEN

## 2017-07-26 LAB — RAPID URINE DRUG SCREEN, HOSP PERFORMED
Amphetamines: NOT DETECTED
Barbiturates: NOT DETECTED
Benzodiazepines: NOT DETECTED
COCAINE: NOT DETECTED
OPIATES: NOT DETECTED
TETRAHYDROCANNABINOL: NOT DETECTED

## 2017-07-26 LAB — ETHANOL: Alcohol, Ethyl (B): <5 mg/dL (ref <5)

## 2017-07-26 LAB — VALPROIC ACID LEVEL: VALPROIC ACID LVL: 11 ug/mL — AB (ref 50.0–100.0)

## 2017-07-26 MED ORDER — DIVALPROEX SODIUM 125 MG PO CSDR
250.0000 mg | DELAYED_RELEASE_CAPSULE | Freq: Three times a day (TID) | ORAL | Status: DC
  Administered 2017-07-28: 250 mg via ORAL
  Filled 2017-07-26 (×6): qty 2

## 2017-07-26 MED ORDER — METOPROLOL SUCCINATE 12.5 MG HALF TABLET
12.5000 mg | ORAL_TABLET | Freq: Every day | ORAL | Status: DC
  Administered 2017-07-28: 12.5 mg via ORAL
  Filled 2017-07-26 (×2): qty 1

## 2017-07-26 MED ORDER — RISPERIDONE 1 MG/ML PO SOLN
0.5000 mg | Freq: Every day | ORAL | Status: DC
  Administered 2017-07-28: 0.5 mg via ORAL
  Filled 2017-07-26 (×2): qty 0.5

## 2017-07-26 MED ORDER — HYDROXYZINE PAMOATE 25 MG PO CAPS: 25.0000 mg | ORAL_CAPSULE | Freq: Three times a day (TID) | ORAL | Status: DC

## 2017-07-26 MED ORDER — DOCUSATE SODIUM 100 MG PO CAPS
100.0000 mg | ORAL_CAPSULE | Freq: Every day | ORAL | Status: DC
  Administered 2017-07-28: 100 mg via ORAL
  Filled 2017-07-26 (×2): qty 1

## 2017-07-26 MED ORDER — HYDROXYZINE HCL 25 MG PO TABS
25.0000 mg | ORAL_TABLET | Freq: Three times a day (TID) | ORAL | Status: DC
  Administered 2017-07-28: 25 mg via ORAL
  Filled 2017-07-26 (×2): qty 1

## 2017-07-26 MED ORDER — LORAZEPAM 0.5 MG PO TABS
0.1250 mg | ORAL_TABLET | Freq: Every day | ORAL | Status: DC
  Administered 2017-07-28: 0.25 mg via ORAL
  Filled 2017-07-26 (×2): qty 1

## 2017-07-26 MED ORDER — FERROUS SULFATE 325 (65 FE) MG PO TABS
325.0000 mg | ORAL_TABLET | Freq: Two times a day (BID) | ORAL | Status: DC
  Administered 2017-07-28: 325 mg via ORAL
  Filled 2017-07-26 (×3): qty 1

## 2017-07-26 NOTE — ED Triage Notes (Signed)
Per EMS-states being sent by facility for aggression-states B/L lower extremity edema-states he has refused vital signs-patient is sexually preoccupied-facility also wants a psych eval

## 2017-07-26 NOTE — ED Provider Notes (Signed)
[facility]-EMERGENCY DEPT Provider Note   CSN: [id number] Arrival date & time: 07/26/17  1515     History   Chief Complaint Chief Complaint  Patient presents with  . B/L LE swelling  . aggressive/psych eval    HPI [name] is a 83 y.o. male.  83 year old male presents from nursing home with increased agitation as well as combative behavior. Patient has a baseline history of dementia and is very hard of hearing the for the history is very limited. According to the medical records,he has been not cooperative. No reported fever, vomiting, diarrhea. No recent medication changes. Was sent her for further evaluation.      Past Medical History:  Diagnosis Date  . Anemia   . Aortic stenosis   . Arthritis   . Dementia   . HOH (hard of hearing)   . Hyperlipidemia   . Hypertension   . Renal insufficiency   . Type 2 diabetes mellitus (HCC)    borderline    Patient Active Problem List   Diagnosis Date Noted  . Age-related cognitive decline 09/25/2016  . 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 AORTIC VALVE REPLACEMENT, HX OF 01/15/2010  . ATRIAL FIBRILLATION 10/07/2009  . RENAL INSUFFICIENCY 08/14/2009  . CONGENITAL FACTOR VIII DISORDER 08/07/2009  . ACUTE RHEUMATIC ENDOCARDITIS 08/07/2009  . CORONARY ATHEROSCLEROSIS NATIVE CORONARY ARTERY 08/07/2009  . DM 06/10/2009  . HYPERCHOLESTEROLEMIA, PURE 11/20/2008  . HYPERTENSION, BENIGN 11/20/2008  . AORTIC STENOSIS/ INSUFFICIENCY, NON-RHEUMATIC 11/20/2008    Past Surgical History:  Procedure Laterality Date  . AORTIC VALVE REPLACEMENT  November 2010  . BACK SURGERY    . KNEE SURGERY     Left Knee  . OLECRANON BURSECTOMY  04/04/2012   Procedure: OLECRANON BURSA;  Surgeon: Tami Ribas, MD;  Location:  [facility]ERY [facility];  Service: Orthopedics;  Laterality: Left;  left elbow bursa I&D  . TONSILLECTOMY         Home Medications    Prior to Admission medications     Medication Sig Start Date End Date Taking? Authorizing Provider  aspirin 81 MG tablet Take 81 mg by mouth daily.    Yes [provider]  divalproex (DEPAKOTE SPRINKLE) 125 MG capsule Take 250 mg by mouth 3 (three) times daily. 07/22/17  Yes [provider]  docusate sodium (COLACE) 100 MG capsule Take 100 mg by mouth daily.   Yes [provider]  ferrous sulfate 325 (65 FE) MG EC tablet Take 325 mg by mouth 2 (two) times daily.   Yes [provider]  hydrOXYzine (VISTARIL) 25 MG capsule Take 25 mg by mouth 3 (three) times daily. 07/04/17  Yes [provider]  LORazepam (ATIVAN) 0.5 MG tablet Take 0.25 tablets by mouth daily. 07/26/17  Yes [provider]  metoprolol succinate (TOPROL-XL) 25 MG 24 hr tablet Take 12.5 mg by mouth daily. 07/19/17  Yes [provider]  risperiDONE (RISPERDAL) 1 MG/ML oral solution Take 0.5 mg by mouth daily. 05/19/17  Yes [provider]  HYDROcodone-acetaminophen (NORCO) 5-325 MG per tablet 1-2 tabs po q6 hours prn pain Patient not taking: Reported on 09/24/2016 04/04/12   Betha Loa, MD  metoprolol tartrate (LOPRESSOR) 25 MG tablet TAKE 1 TABLET BY MOUTH EVERY DAY Patient not taking: Reported on 07/26/2017 02/12/12   Lewayne Bunting, MD    Family History No family history on file.  Social History Social History  Substance Use Topics  . Smoking status: Never Smoker  .  Smokeless tobacco: Not on file  . Alcohol use No     Allergies   Patient has no known allergies.   Review of Systems Review of Systems  Unable to perform ROS: Dementia     Physical Exam Updated Vital Signs BP (!) 129/44 (BP Location: Left Arm)   Pulse 74   Temp (!) 97.5 F (36.4 C) (Oral)   Resp 16   SpO2 100%   Physical Exam  Constitutional: He appears well-developed and well-nourished.  Non-toxic appearance. No distress.  HENT:  Head: Normocephalic and atraumatic.  Eyes: Pupils are equal, round, and  reactive to light. Conjunctivae, EOM and lids are normal.  Neck: Normal range of motion. Neck supple. No tracheal deviation present. No thyroid mass present.  Cardiovascular: Normal rate, regular rhythm and normal heart sounds.  Exam reveals no gallop.   No murmur heard. Pulmonary/Chest: Effort normal and breath sounds normal. No stridor. No respiratory distress. He has no decreased breath sounds. He has no wheezes. He has no rhonchi. He has no rales.  Abdominal: Soft. Normal appearance and bowel sounds are normal. He exhibits no distension. There is no tenderness. There is no rebound and no CVA tenderness.  Musculoskeletal: Normal range of motion. He exhibits no edema or tenderness.  Lymphadenopathy:  3+ bilateral lower extremity edema.  Neurological: He is alert. He has normal strength. He is not disoriented. No cranial nerve deficit or sensory deficit. GCS eye subscore is 4. GCS verbal subscore is 5. GCS motor subscore is 6.  Skin: Skin is warm and dry. No abrasion and no rash noted.  Psychiatric: His affect is labile. His speech is rapid and/or pressured. He is agitated. He is not actively hallucinating. He expresses impulsivity. He expresses no suicidal plans and no homicidal plans.  Nursing note and vitals reviewed.    ED Treatments / Results  Labs (all labs ordered are listed, but only abnormal results are displayed) Labs Reviewed  COMPREHENSIVE METABOLIC PANEL  ETHANOL  RAPID URINE DRUG SCREEN, HOSP PERFORMED  CBC WITH DIFFERENTIAL/PLATELET  URINALYSIS, ROUTINE W REFLEX MICROSCOPIC  VALPROIC ACID LEVEL    EKG  EKG Interpretation None       Radiology No results found.  Procedures Procedures (including critical care time)  Medications Ordered in ED Medications - No data to display   Initial Impression / Assessment and Plan / ED Course  I have reviewed the triage vital signs and the nursing notes.  Pertinent labs & imaging results that were available during my  care of the patient were reviewed by me and considered in my medical decision making (see chart for details).     Patient's renal insufficiency is chronic. His urinalysis is without signs of infection. He is medically clear for psychiatric disposition  Final Clinical Impressions(s) / ED Diagnoses   Final diagnoses:  None    New Prescriptions New Prescriptions   No medications on file     Lorre NickAllen, Tyren Dugar, MD 07/26/17 2041

## 2017-07-26 NOTE — ED Notes (Signed)
Bed: WA26 Expected date:  Expected time:  Means of arrival:  Comments: EMS-aggressive 

## 2017-07-26 NOTE — BH Assessment (Addendum)
Assessment Note  [name] is an 83 y.o. male, who [facility] Clinician from RN that pt is hard of hearing. Clinician asked the pt, "why he is at the hospital?" Pt reported, to get my gallbladder checked, ask him." Pt was gesturing to the RN. Clinician asked the pt if he has been feeling angry or upset? Pt reported, he was not angry. Clinician asked the pt if he has been feeling suicidal or homicidal? Pt reported, "no not lately, that I can think of."  Per EDP note: "83 year old male presents from nursing home with increased agitation as well as combative behavior. Patient has a baseline history of dementia and is very hard of hearing the for the history is very limited. According to the medical records,he has been not cooperative. No reported fever, vomiting, diarrhea. No recent medication changes. Was sent her for further evaluation."  Clinician was unable to assess the following: martial status, legal guardian, OPT resources, legal involvement, AVH, history of abuse, family supports, sleep, appetite, self-injurious behaviors, access to weapons, impulse control, orientation, ADLs, contract for safety, history of violence, education status,  Mood, previous inpatient admissions. Pt's UDS is negative.   Pt presented, quiet/awake in a hospital gown with jeans, with slow speech. Pt's eye contact was fair. Pt's affect was flat. Pt's thought process was circumstantial. Pt's concentration and insight are poor.  Diagnosis: Deferred  Past Medical History:  Past Medical History:  Diagnosis Date  . Anemia   . Aortic stenosis   . Arthritis   . Dementia   . HOH (hard of hearing)   . Hyperlipidemia   . Hypertension   . Renal insufficiency   . Type 2 diabetes mellitus (HCC)    borderline    Past Surgical History:  Procedure Laterality Date  . AORTIC VALVE REPLACEMENT  November 2010  . BACK SURGERY    . KNEE SURGERY     Left Knee  . OLECRANON BURSECTOMY   04/04/2012   Procedure: OLECRANON BURSA;  Surgeon: Tami RibasKevin R Kuzma, MD;  Location: [facility];  Service: Orthopedics;  Laterality: Left;  left elbow bursa I&D  . TONSILLECTOMY      Family History: No family history on file.  Social History:  reports that he has never smoked. He does not have any smokeless tobacco history on file. He reports that he does not drink alcohol or use drugs.  Additional Social History:  Alcohol / Drug Use Pain Medications: See MAR Prescriptions: See MAR Over the Counter: See MAR History of alcohol / drug use?: No history of alcohol / drug abuse (UDS is negative. )  CIWA: CIWA-Ar BP: (!) 129/44 Pulse Rate: 74 COWS:    Allergies: No Known Allergies  Home Medications:  (Not in a hospital admission)  OB/GYN Status:  No LMP for male patient.  General Assessment Data Location of Assessment: [facility] TTS Assessment: In system Is this a Tele or Face-to-Face Assessment?: Face-to-Face Is this an Initial Assessment or a Re-assessment for this encounter?: Initial Assessment Marital status: Other (comment) (UTA) Living Arrangements: Other (Comment) (Pt reported in a house. ) Can pt return to current living arrangement?:  (UTA) Admission Status: Voluntary Is patient capable of signing voluntary admission?: Yes Referral Source: Other (Per EDP note, nursing home.) Insurance type: [employer] [facility] [employer] Medicare.      Crisis Care Plan Living Arrangements: Other (Comment) (Pt reported in a house. ) Legal Guardian:  (UTA) Name of Psychiatrist: UTA Name of Therapist: UTA  Education Status Is patient currently in school?:  (UTA) Current Grade: UTA Highest grade of school patient has completed: UTA Name of school: UTA Contact person: UTA  Risk to self with the past 6 months Suicidal Ideation: No (Pt denies.) Has patient been a risk to self within the past 6 months prior to admission? :  (UTA) Suicidal Intent:  (UTA) Has patient had any suicidal intent within the  past 6 months prior to admission? : Other (comment) (UTA) Is patient at risk for suicide?: No Suicidal Plan?:  (UYS) Has patient had any suicidal plan within the past 6 months prior to admission? : Other (comment) (UTA) Access to Means:  (UTA) What has been your use of drugs/alcohol within the last 12 months?: Pt's UDS is negative.  Previous Attempts/Gestures:  (UTA) How many times?:  (UTA) Other Self Harm Risks: UTA Triggers for Past Attempts:  (UTA) Intentional Self Injurious Behavior:  (UTA) Family Suicide History: Unable to assess Recent stressful life event(s): Other (Comment) (UTA) Persecutory voices/beliefs?:  Rich Reining) Depression:  (UTA) Depression Symptoms:  (UTA) Substance abuse history and/or treatment for substance abuse?:  (UTA) Suicide prevention information given to non-admitted patients:  (UTA)  Risk to Others within the past 6 months Homicidal Ideation: No (Pt denies. ) Does patient have any lifetime risk of violence toward others beyond the six months prior to admission? : Yes (comment) (Per EDP note pt was comabtive at nursing home. ) Thoughts of Harm to Others:  (UTA) Current Homicidal Intent:  (UTA) Current Homicidal Plan:  (UTA) Access to Homicidal Means:  (UTA) Identified Victim: UTA History of harm to others?:  (UTA) Assessment of Violence:  (UTA) Violent Behavior Description: UTA Does patient have access to weapons?:  (UTA) Criminal Charges Pending?:  (UTA) Does patient have a court date:  (UTA) Is patient on probation?:  (UTA)  Psychosis Hallucinations:  (UTA) Delusions:  (UTA)  Mental Status Report Appearance/Hygiene: In hospital gown (with jeans) Eye Contact: Fair Motor Activity: Unremarkable Speech: Slow Level of Consciousness: Quiet/awake Affect: Flat Anxiety Level: None Thought Processes: Circumstantial Judgement: Impaired Orientation: Unable to assess Obsessive Compulsive Thoughts/Behaviors: Unable to Assess  Cognitive  Functioning Concentration: Poor Memory: Recent Impaired, Remote Impaired IQ: Average Insight: Poor Impulse Control: Unable to Assess Appetite:  (UTA) Sleep: Unable to Assess Vegetative Symptoms: Unable to Assess  ADLScreening [facility] Assessment Services) Patient's cognitive ability adequate to safely complete daily activities?: No Patient able to express need for assistance with ADLs?: Yes Independently performs ADLs?:  (UTA)  Prior Inpatient Therapy Prior Inpatient Therapy:  (UTA) Prior Therapy Dates: UTA Prior Therapy Facilty/Provider(s): UTA Reason for Treatment: UTA  Prior Outpatient Therapy Prior Outpatient Therapy:  (UTA) Prior Therapy Dates: UTA Prior Therapy Facilty/Provider(s): UTA Reason for Treatment: UTA Does patient have an ACCT team?: Unknown Does patient have Intensive In-House Services?  : Unknown Does patient have [facility] services? : Unknown Does patient have P4CC services?: Unknown  ADL Screening (condition at time of admission) Patient's cognitive ability adequate to safely complete daily activities?: No Is the patient deaf or have difficulty hearing?: Yes Does the patient have difficulty seeing, even when wearing glasses/contacts?:  (UTA) Does the patient have difficulty concentrating, remembering, or making decisions?:  (UTA) Patient able to express need for assistance with ADLs?: Yes Does the patient have difficulty dressing or bathing?:  (UTA) Independently performs ADLs?:  (UTA) Does the patient have difficulty walking or climbing stairs?:  (UTA) Weakness of Legs:  (UTA) Weakness of Arms/Hands:  (UTA)  Abuse/Neglect Assessment (Assessment to be complete while patient is alone) Physical Abuse:  (UTA) Verbal Abuse:  (UTA) Sexual Abuse:  (UTA) Exploitation of patient/patient's resources:  Industrial/product designer) Self-Neglect:  (UTA)     Advance Directives (For Healthcare) Does Patient Have a Medical Advance Directive?: No Would patient like information on  creating a medical advance directive?: No - Patient declined    Additional Information 1:1 In Past 12 Months?: No CIRT Risk: No Elopement Risk: No Does patient have medical clearance?: Yes     Disposition: Donell Sievert, PA recommends overnight observation and re-evaluation in the morning. Disposition discussed with Misty Stanley, PA and Reita Cliche, Charity fundraiser.   Disposition Initial Assessment Completed for this Encounter: Yes Disposition of Patient: Other dispositions (AM Psychiatric Evaluation. ) Other disposition(s): Other (Comment) (AM Psychiatric Evaluation. )  On Site Evaluation by:   Reviewed with Physician: Misty Stanley, PA and Reita Cliche, RN.   Redmond Pulling 07/27/2017 12:05 AM    Redmond Pulling, MS, [facility], CRC Triage Specialist [phone]

## 2017-07-27 ENCOUNTER — Emergency Department (HOSPITAL_COMMUNITY): Payer: Medicare Other

## 2017-07-27 MED ORDER — HALOPERIDOL LACTATE 5 MG/ML IJ SOLN
5.0000 mg | Freq: Once | INTRAMUSCULAR | Status: AC
  Administered 2017-07-27: 5 mg via INTRAMUSCULAR
  Filled 2017-07-27: qty 1

## 2017-07-27 MED ORDER — LORAZEPAM 2 MG/ML IJ SOLN
1.0000 mg | Freq: Once | INTRAMUSCULAR | Status: AC
  Administered 2017-07-27: 1 mg via INTRAMUSCULAR
  Filled 2017-07-27: qty 1

## 2017-07-27 MED ORDER — HALOPERIDOL LACTATE 5 MG/ML IJ SOLN: 5.0000 mg | Freq: Once | INTRAMUSCULAR | Status: DC

## 2017-07-27 NOTE — ED Notes (Addendum)
Pt refused meds, refuses offers of fluids/foods & to obtain VS.  Pt stating "It's none of your business.  That's the 3rd time I've told you it's none of your business."

## 2017-07-27 NOTE — ED Notes (Signed)
OOB stripped bed and requesting to take bed linens home to wash them. Pt states his car is in the parking lot and he needs his keys. Redirected by staff and several attempts to reorient to current situation were unsuccessful. Was able to convince [name] to have a seat at the door of his room and he is able to remain there without further redirection.

## 2017-07-27 NOTE — Progress Notes (Signed)
Patient is agitated and physically aggressive. EDP notified and orders received. Will continue to monitor patient.

## 2017-07-27 NOTE — ED Notes (Signed)
Pt refused vitals 

## 2017-07-27 NOTE — ED Notes (Addendum)
[name] [name] displaying aggressive behavior such as posturing and verball aggressive threats to assault staff when asked to dress out in hospital gown. He also refused PM medications when offered.

## 2017-07-27 NOTE — ED Notes (Signed)
Pt attempting to leave room & push by sitter.

## 2017-07-27 NOTE — ED Notes (Signed)
Dr. Jeraldine LootsLockwood informed of pt's aggression.  No orders received.

## 2017-07-27 NOTE — Progress Notes (Signed)
Patient refused afternoon medications.

## 2017-07-27 NOTE — Progress Notes (Signed)
Patient is agitated and physically aggressive with staff. EDP notiified and orders received. Will continue to monitor patient.

## 2017-07-27 NOTE — BH Assessment (Signed)
[facility] Assessment Progress Note  Per Thedore MinsMojeed Akintayo, MD, this pt requires psychiatric hospitalization at this time.  The following facilities have been contacted to seek placement for this pt, with results as noted:  [facility]   Aida Lemaire, [place] Triage Specialist [phone]

## 2017-07-27 NOTE — Progress Notes (Signed)
Patient refused all morning medications. Will continue to monitor patient.

## 2017-07-28 DIAGNOSIS — F0391 Unspecified dementia with behavioral disturbance: Secondary | ICD-10-CM

## 2017-07-28 DIAGNOSIS — R413 Other amnesia: Secondary | ICD-10-CM

## 2017-07-28 DIAGNOSIS — F0390 Unspecified dementia without behavioral disturbance: Secondary | ICD-10-CM | POA: Diagnosis present

## 2017-07-28 DIAGNOSIS — R454 Irritability and anger: Secondary | ICD-10-CM | POA: Diagnosis not present

## 2017-07-28 DIAGNOSIS — R451 Restlessness and agitation: Secondary | ICD-10-CM | POA: Diagnosis not present

## 2017-07-28 MED ORDER — DIVALPROEX SODIUM 125 MG PO CSDR: 250.0000 mg | DELAYED_RELEASE_CAPSULE | Freq: Two times a day (BID) | ORAL | Status: DC

## 2017-07-28 MED ORDER — HYDROXYZINE HCL 10 MG PO TABS: 10.0000 mg | ORAL_TABLET | Freq: Three times a day (TID) | ORAL | 0 refills | Status: DC

## 2017-07-28 MED ORDER — HYDROXYZINE HCL 10 MG PO TABS: 10.0000 mg | ORAL_TABLET | Freq: Three times a day (TID) | ORAL | 1 refills | Status: AC

## 2017-07-28 MED ORDER — HYDROXYZINE HCL 10 MG PO TABS: 10.0000 mg | ORAL_TABLET | Freq: Three times a day (TID) | ORAL | Status: DC

## 2017-07-28 MED ORDER — RISPERIDONE 1 MG/ML PO SOLN: 0.2500 mg | Freq: Two times a day (BID) | ORAL | 0 refills | Status: AC

## 2017-07-28 MED ORDER — RISPERIDONE 1 MG/ML PO SOLN: 0.2500 mg | Freq: Two times a day (BID) | ORAL | Status: DC

## 2017-07-28 NOTE — ED Notes (Signed)
Patient denies pain and is resting comfortably.  

## 2017-07-28 NOTE — ED Notes (Signed)
Social work called and spoken with personnel in admissions, discussed medication changes, and patient return.

## 2017-07-28 NOTE — ED Notes (Signed)
Attempted to call report x 3. 

## 2017-07-28 NOTE — Consult Note (Signed)
[place] Psychiatry Consult   Reason for Consult:  Agitation, aggression Referring Physician:  EDP Patient Identification: [name] MRN:  [id number] Principal Diagnosis: Dementia with behavioral disturbance Diagnosis:   Patient Active Problem List   Diagnosis Date Noted  . Dementia with behavioral disturbance [F03.91] 07/28/2017  . Age-related cognitive decline [R41.81] 09/25/2016  . Adjustment disorder with mixed disturbance of emotions and conduct [F43.25] 09/25/2016  . AORTIC VALVE REPLACEMENT, HX OF [Z95.4] 01/15/2010  . ATRIAL FIBRILLATION [I48.91] 10/07/2009  . RENAL INSUFFICIENCY [N25.9] 08/14/2009  . 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 ACUTE RHEUMATIC ENDOCARDITIS [I01.1] 08/07/2009  . CORONARY ATHEROSCLEROSIS NATIVE CORONARY ARTERY [I25.10] 08/07/2009  . DM [E11.9] 06/10/2009  . HYPERCHOLESTEROLEMIA, PURE [E78.00] 11/20/2008  . HYPERTENSION, BENIGN [I10] 11/20/2008  . AORTIC STENOSIS/ INSUFFICIENCY, NON-RHEUMATIC [I35.9] 11/20/2008    Total Time spent with patient: 45 minutes  Subjective:   [name] is a 83 y.o. male patient admitted due to increased agitation and aggression.  HPI: Patient with history of multiple medical problem including Dementia who was brought from his nursing home due to increasing agitation and combative behavior. Patient was placed on medications and seems stable today. He is calm, cooperative and had a good night sleep.  Past Psychiatric History: denies  Risk to Self: Suicidal Ideation: No (Pt denies.) Suicidal Intent:  (UTA) Is patient at risk for suicide?: No Suicidal Plan?:  (UYS) Access to Means:  (UTA) What has been your use of drugs/alcohol within the last 12 months?: Pt's UDS is negative.  How many times?:  (UTA) Other Self Harm Risks: UTA Triggers for Past Attempts:  (UTA) Intentional Self Injurious Behavior:  (UTA) Risk to Others: Homicidal Ideation: No (Pt denies. ) Thoughts of Harm to  Others:  (UTA) Current Homicidal Intent:  (UTA) Current Homicidal Plan:  (UTA) Access to Homicidal Means:  (UTA) Identified Victim: UTA History of harm to others?:  (UTA) Assessment of Violence:  (UTA) Violent Behavior Description: UTA Does patient have access to weapons?:  (UTA) Criminal Charges Pending?:  (UTA) Does patient have a court date:  Special educational needs teacher) Prior Inpatient Therapy: Prior Inpatient Therapy:  (UTA) Prior Therapy Dates: UTA Prior Therapy Facilty/Provider(s): UTA Reason for Treatment: UTA Prior Outpatient Therapy: Prior Outpatient Therapy:  (UTA) Prior Therapy Dates: UTA Prior Therapy Facilty/Provider(s): UTA Reason for Treatment: UTA Does patient have an ACCT team?: Unknown Does patient have Intensive In-House Services?  : Unknown Does patient have [facility] services? : Unknown Does patient have P4CC services?: Unknown  Past Medical History:  Past Medical History:  Diagnosis Date  . Anemia   . Aortic stenosis   . Arthritis   . Dementia   . HOH (hard of hearing)   . Hyperlipidemia   . Hypertension   . Renal insufficiency   . Type 2 diabetes mellitus ([place])    borderline    Past Surgical History:  Procedure Laterality Date  . AORTIC VALVE REPLACEMENT  November 2010  . BACK SURGERY    . KNEE SURGERY     Left Knee  . OLECRANON BURSECTOMY  04/04/2012   Procedure: OLECRANON BURSA;  Surgeon: Tennis Must, MD;  Location: [place];  Service: Orthopedics;  Laterality: Left;  left elbow bursa I&D  . TONSILLECTOMY     Family History: No family history on file. Family Psychiatric  History: Social History:  History  Alcohol Use No     History  Drug Use No    Social History   Social History  .  Marital status: Widowed    Spouse name: N/A  . Number of children: N/A  . Years of education: N/A   Occupational History  . Retired Agricultural consultant    Social History Main Topics  . Smoking status: Never Smoker  . Smokeless tobacco: None  . Alcohol use No  .  Drug use: No  . Sexual activity: Not Asked   Other Topics Concern  . None   Social History Narrative  . None   Additional Social History:    Allergies:  No Known Allergies  Labs:  Results for orders placed or performed during the hospital encounter of 07/26/17 (from the past 48 hour(s))  Comprehensive metabolic panel     Status: Abnormal   Collection Time: 07/26/17  3:30 PM  Result Value Ref Range   Sodium 137 135 - 145 mmol/L   Potassium 4.3 3.5 - 5.1 mmol/L   Chloride 107 101 - 111 mmol/L   CO2 22 22 - 32 mmol/L   Glucose, Bld 156 (H) 65 - 99 mg/dL   BUN 33 (H) 6 - 20 mg/dL   Creatinine, Ser 2.12 (H) 0.61 - 1.24 mg/dL   Calcium 8.9 8.9 - 10.3 mg/dL   Total Protein 5.8 (L) 6.5 - 8.1 g/dL   Albumin 3.7 3.5 - 5.0 g/dL   AST 19 15 - 41 U/L   ALT 12 (L) 17 - 63 U/L   Alkaline Phosphatase 84 38 - 126 U/L   Total Bilirubin 0.5 0.3 - 1.2 mg/dL   GFR calc non Af Amer 26 (L) >60 mL/min   GFR calc Af Amer 30 (L) >60 mL/min    Comment: (NOTE) The eGFR has been calculated using the CKD EPI equation. This calculation has not been validated in all clinical situations. eGFR's persistently <60 mL/min signify possible Chronic Kidney Disease.    Anion gap 8 5 - 15  Ethanol     Status: None   Collection Time: 07/26/17  3:30 PM  Result Value Ref Range   Alcohol, Ethyl (B) <5 <5 mg/dL    Comment:        LOWEST DETECTABLE LIMIT FOR SERUM ALCOHOL IS 5 mg/dL FOR MEDICAL PURPOSES ONLY   CBC with Diff     Status: Abnormal   Collection Time: 07/26/17  3:30 PM  Result Value Ref Range   WBC 5.0 4.0 - 10.5 K/uL   RBC 3.23 (L) 4.22 - 5.81 MIL/uL   Hemoglobin 9.8 (L) 13.0 - 17.0 g/dL   HCT 29.7 (L) 39.0 - 52.0 %   MCV 92.0 78.0 - 100.0 fL   MCH 30.3 26.0 - 34.0 pg   MCHC 33.0 30.0 - 36.0 g/dL   RDW 14.3 11.5 - 15.5 %   Platelets 158 150 - 400 K/uL   Neutrophils Relative % 49 %   Neutro Abs 2.4 1.7 - 7.7 K/uL   Lymphocytes Relative 29 %   Lymphs Abs 1.5 0.7 - 4.0 K/uL   Monocytes  Relative 12 %   Monocytes Absolute 0.6 0.1 - 1.0 K/uL   Eosinophils Relative 10 %   Eosinophils Absolute 0.5 0.0 - 0.7 K/uL   Basophils Relative 0 %   Basophils Absolute 0.0 0.0 - 0.1 K/uL  Valproic acid level     Status: Abnormal   Collection Time: 07/26/17  3:30 PM  Result Value Ref Range   Valproic Acid Lvl 11 (L) 50.0 - 100.0 ug/mL  Urine rapid drug screen (hosp performed)     Status: None  Collection Time: 07/26/17  7:58 PM  Result Value Ref Range   Opiates NONE DETECTED NONE DETECTED   Cocaine NONE DETECTED NONE DETECTED   Benzodiazepines NONE DETECTED NONE DETECTED   Amphetamines NONE DETECTED NONE DETECTED   Tetrahydrocannabinol NONE DETECTED NONE DETECTED   Barbiturates NONE DETECTED NONE DETECTED    Comment:        DRUG SCREEN FOR MEDICAL PURPOSES ONLY.  IF CONFIRMATION IS NEEDED FOR ANY PURPOSE, NOTIFY LAB WITHIN 5 DAYS.        LOWEST DETECTABLE LIMITS FOR URINE DRUG SCREEN Drug Class       Cutoff (ng/mL) Amphetamine      1000 Barbiturate      200 Benzodiazepine   301 Tricyclics       601 Opiates          300 Cocaine          300 THC              50   Urinalysis, Routine w reflex microscopic     Status: Abnormal   Collection Time: 07/26/17  7:58 PM  Result Value Ref Range   Color, Urine YELLOW YELLOW   APPearance CLEAR CLEAR   Specific Gravity, Urine 1.011 1.005 - 1.030   pH 5.0 5.0 - 8.0   Glucose, UA NEGATIVE NEGATIVE mg/dL   Hgb urine dipstick NEGATIVE NEGATIVE   Bilirubin Urine NEGATIVE NEGATIVE   Ketones, ur NEGATIVE NEGATIVE mg/dL   Protein, ur NEGATIVE NEGATIVE mg/dL   Nitrite NEGATIVE NEGATIVE   Leukocytes, UA TRACE (A) NEGATIVE   RBC / HPF 0-5 0 - 5 RBC/hpf   WBC, UA 0-5 0 - 5 WBC/hpf   Bacteria, UA NONE SEEN NONE SEEN   Squamous Epithelial / LPF NONE SEEN NONE SEEN    Current Facility-Administered Medications  Medication Dose Route Frequency Provider Last Rate Last Dose  . divalproex (DEPAKOTE SPRINKLE) capsule 250 mg  250 mg Oral Q12H  Eylin Pontarelli, MD      . docusate sodium (COLACE) capsule 100 mg  100 mg Oral Daily Lacretia Leigh, MD   100 mg at 07/28/17 0901  . ferrous sulfate tablet 325 mg  325 mg Oral BID PC Lacretia Leigh, MD   325 mg at 07/28/17 0932  . haloperidol lactate (HALDOL) injection 5 mg  5 mg Intramuscular Once Carmin Muskrat, MD      . hydrOXYzine (ATARAX/VISTARIL) tablet 10 mg  10 mg Oral TID Corena Pilgrim, MD      . metoprolol succinate (TOPROL-XL) 24 hr tablet 12.5 mg  12.5 mg Oral Daily Lacretia Leigh, MD   12.5 mg at 07/28/17 0901  . risperiDONE (RISPERDAL) 1 MG/ML oral solution 0.25 mg  0.25 mg Oral BID Corena Pilgrim, MD       Current Outpatient Prescriptions  Medication Sig Dispense Refill  . aspirin 81 MG tablet Take 81 mg by mouth daily.     . divalproex (DEPAKOTE SPRINKLE) 125 MG capsule Take 250 mg by mouth 3 (three) times daily.    Marland Kitchen docusate sodium (COLACE) 100 MG capsule Take 100 mg by mouth daily.    . ferrous sulfate 325 (65 FE) MG EC tablet Take 325 mg by mouth 2 (two) times daily.    . hydrOXYzine (VISTARIL) 25 MG capsule Take 25 mg by mouth 3 (three) times daily.    Marland Kitchen LORazepam (ATIVAN) 0.5 MG tablet Take 0.25 tablets by mouth daily.    . metoprolol succinate (TOPROL-XL) 25 MG 24 hr tablet Take  12.5 mg by mouth daily.    . risperiDONE (RISPERDAL) 1 MG/ML oral solution Take 0.5 mg by mouth daily.    Marland Kitchen HYDROcodone-acetaminophen (NORCO) 5-325 MG per tablet 1-2 tabs po q6 hours prn pain (Patient not taking: Reported on 09/24/2016) 30 tablet 0  . metoprolol tartrate (LOPRESSOR) 25 MG tablet TAKE 1 TABLET BY MOUTH EVERY DAY (Patient not taking: Reported on 07/26/2017) 30 tablet 11    Musculoskeletal: Strength & Muscle Tone: within normal limits Gait & Station: unsteady Patient leans: Front  Psychiatric Specialty Exam: Physical Exam  Psychiatric: His speech is normal and behavior is normal. Judgment and thought content normal. His affect is blunt. Cognition and memory are  impaired.    Review of Systems  Constitutional: Negative.   HENT: Negative.   Eyes: Negative.   Respiratory: Negative.   Cardiovascular: Negative.   Gastrointestinal: Negative.   Genitourinary: Negative.   Skin: Negative.   Neurological: Negative.   Endo/Heme/Allergies: Negative.   Psychiatric/Behavioral: Positive for memory loss.    Blood pressure 123/60, pulse 97, temperature 97.6 F (36.4 C), temperature source Oral, resp. rate 18, SpO2 99 %.There is no height or weight on file to calculate BMI.  General Appearance: Casual  Eye Contact:  Fair  Speech:  Clear and Coherent  Volume:  Normal  Mood:  Anxious  Affect:  Constricted  Thought Process:  Disorganized  Orientation:  Other:  only to person  Thought Content:  Illogical  Suicidal Thoughts:  No  Homicidal Thoughts:  No  Memory:  Immediate;   Fair Recent;   Poor Remote;   Poor  Judgement:  Impaired  Insight:  Shallow  Psychomotor Activity:  Restlessness  Concentration:  Concentration: Fair and Attention Span: Fair  Recall:  Poor  Fund of Knowledge:  unable to assess  Language:  Fair  Akathisia:  No  Handed:  Right  AIMS (if indicated):     Assets:  Armed forces logistics/support/administrative officer Social Support Others:  lives in nursing home  ADL's:  Impaired  Cognition:  Impaired,  Moderate  Sleep:   fair     Treatment Plan Summary: Patient with Dementia who was admitted due to changes in behavior. Today, patient is calm, cooperative after his medications were re-adjusted. As such, patient is cleared by psychiatric service to return to his nursing home.  Disposition: No evidence of imminent risk to self or others at present.   Patient does not meet criteria for psychiatric inpatient admission. Supportive therapy provided about ongoing stressors.  Corena Pilgrim, MD 07/28/2017 10:10 AM

## 2017-07-28 NOTE — ED Notes (Signed)
Pt is currently re-directable.  Pt is walking around in room.  Sitter remains with pt.

## 2017-07-28 NOTE — ED Notes (Signed)
[facility] called  

## 2017-07-28 NOTE — Progress Notes (Signed)
[facility] medically/ psych [facility] at this time. [facility] is able to accept patient back at this time. CSW updated RN. [facility] called for transport.   Stacy GardnerErin Ashlyne Olenick, [facility] Emergency Room Clinical Social Worker [phone]

## 2017-07-28 NOTE — [facility] Suicide Risk Assessment (Signed)
Suicide Risk Assessment  Discharge Assessment   [facility]   Principal Problem: Dementia with behavioral disturbance Discharge Diagnoses:  Patient Active Problem List   Diagnosis Date Noted  . Dementia with behavioral disturbance [F03.91] 07/28/2017    Priority: High  . Age-related cognitive decline [R41.81] 09/25/2016  . AORTIC VALVE REPLACEMENT, HX OF [Z95.4] 01/15/2010  . ATRIAL FIBRILLATION [I48.91] 10/07/2009  . RENAL INSUFFICIENCY [N25.9] 08/14/2009  . 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 ACUTE RHEUMATIC ENDOCARDITIS [I01.1] 08/07/2009  . CORONARY ATHEROSCLEROSIS NATIVE CORONARY ARTERY [I25.10] 08/07/2009  . DM [E11.9] 06/10/2009  . HYPERCHOLESTEROLEMIA, PURE [E78.00] 11/20/2008  . HYPERTENSION, BENIGN [I10] 11/20/2008  . AORTIC STENOSIS/ INSUFFICIENCY, NON-RHEUMATIC [I35.9] 11/20/2008    Total Time spent with patient: 30 minutes  Musculoskeletal: Strength & Muscle Tone: within normal limits Gait & Station: unsteady Patient leans: Front  Psychiatric Specialty Exam: Physical Exam  Psychiatric: His speech is normal and behavior is normal. Judgment and thought content normal. His affect is blunt. Cognition and memory are impaired.    Review of Systems  Constitutional: Negative.   HENT: Negative.   Eyes: Negative.   Respiratory: Negative.   Cardiovascular: Negative.   Gastrointestinal: Negative.   Genitourinary: Negative.   Skin: Negative.   Neurological: Negative.   Endo/Heme/Allergies: Negative.   Psychiatric/Behavioral: Positive for memory loss.    Blood pressure 123/60, pulse 97, temperature 97.6 F (36.4 C), temperature source Oral, resp. rate 18, SpO2 99 %.There is no height or weight on file to calculate BMI.  General Appearance: Casual  Eye Contact:  Fair  Speech:  Clear and Coherent  Volume:  Normal  Mood:  Anxious  Affect:  Constricted  Thought Process:  Disorganized  Orientation:  Other:  only to person   Thought Content:  Illogical  Suicidal Thoughts:  No  Homicidal Thoughts:  No  Memory:  Immediate;   Fair Recent;   Poor Remote;   Poor  Judgement:  Impaired  Insight:  Shallow  Psychomotor Activity:  Restlessness  Concentration:  Concentration: Fair and Attention Span: Fair  Recall:  Poor  Fund of Knowledge:  unable to assess  Language:  Fair  Akathisia:  No  Handed:  Right  AIMS (if indicated):     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 Social Support Others:  lives in nursing home  ADL's:  Impaired  Cognition:  Impaired,  Moderate  Sleep:   fair    Mental Status Per Nursing Assessment::   On Admission:   aggression   Demographic Factors:  Male, Age 81 or older and Caucasian  Loss Factors: NA  Historical Factors: Impulsivity  Risk Reduction Factors:   Sense of responsibility to family, Living with another person, especially a relative, Positive social support and Positive therapeutic relationship  Continued Clinical Symptoms:  None  Cognitive Features That Contribute To Risk:  None    Suicide Risk:  Minimal: No identifiable suicidal ideation.  Patients presenting with no risk factors but with morbid ruminations; may be classified as minimal risk based on the severity of the depressive symptoms    Plan Of Care/Follow-up recommendations:  Activity:  as tolerated Diet:  heart healhty diet  Darrian Goodwill, NP 07/28/2017, 10:45 AM

## 2017-07-28 NOTE — [facility] Assessment (Signed)
[facility]    07/28/2017;  Per Dr. Jannifer FranklinAkintayo and Elta GuadeloupeLaurie Parks, NP, patient recommended for discharge. Patient referred to social work services to assist and follow up with discharge.

## 2017-08-15 ENCOUNTER — Emergency Department (HOSPITAL_COMMUNITY)
Admission: EM | Admit: 2017-08-15 | Discharge: 2017-08-15 | Disposition: A | Discharge status: Skilled Nursing Facility | Payer: Medicare Other | Attending: Emergency Medicine | Admitting: Emergency Medicine

## 2017-08-15 ENCOUNTER — Encounter (HOSPITAL_COMMUNITY): Payer: Self-pay | Admitting: Emergency Medicine

## 2017-08-15 ENCOUNTER — Emergency Department (HOSPITAL_COMMUNITY): Payer: Medicare Other

## 2017-08-15 DIAGNOSIS — Y9389 Activity, other specified: Secondary | ICD-10-CM | POA: Insufficient documentation

## 2017-08-15 DIAGNOSIS — E785 Hyperlipidemia, unspecified: Secondary | ICD-10-CM | POA: Diagnosis not present

## 2017-08-15 DIAGNOSIS — Y929 Unspecified place or not applicable: Secondary | ICD-10-CM | POA: Insufficient documentation

## 2017-08-15 DIAGNOSIS — E119 Type 2 diabetes mellitus without complications: Secondary | ICD-10-CM | POA: Insufficient documentation

## 2017-08-15 DIAGNOSIS — M25511 Pain in right shoulder: Secondary | ICD-10-CM | POA: Insufficient documentation

## 2017-08-15 DIAGNOSIS — Z79899 Other long term (current) drug therapy: Secondary | ICD-10-CM | POA: Insufficient documentation

## 2017-08-15 DIAGNOSIS — F0391 Unspecified dementia with behavioral disturbance: Secondary | ICD-10-CM | POA: Diagnosis not present

## 2017-08-15 DIAGNOSIS — W19XXXA Unspecified fall, initial encounter: Secondary | ICD-10-CM | POA: Diagnosis not present

## 2017-08-15 DIAGNOSIS — Y999 Unspecified external cause status: Secondary | ICD-10-CM | POA: Diagnosis not present

## 2017-08-15 DIAGNOSIS — R296 Repeated falls: Secondary | ICD-10-CM

## 2017-08-15 DIAGNOSIS — D649 Anemia, unspecified: Secondary | ICD-10-CM | POA: Insufficient documentation

## 2017-08-15 LAB — COMPREHENSIVE METABOLIC PANEL
ALBUMIN: 3.7 g/dL (ref 3.5–5.0)
ALK PHOS: 83 U/L (ref 38–126)
ALT: 16 U/L — AB (ref 17–63)
AST: 32 U/L (ref 15–41)
Anion gap: 10 (ref 5–15)
BILIRUBIN TOTAL: 0.5 mg/dL (ref 0.3–1.2)
BUN: 41 mg/dL — AB (ref 6–20)
CALCIUM: 9.2 mg/dL (ref 8.9–10.3)
CO2: 25 mmol/L (ref 22–32)
CREATININE: 2.23 mg/dL — AB (ref 0.61–1.24)
Chloride: 101 mmol/L (ref 101–111)
GFR calc Af Amer: 28 mL/min — ABNORMAL LOW (ref >60)
GFR, EST NON AFRICAN AMERICAN: 24 mL/min — AB (ref >60)
GLUCOSE: 124 mg/dL — AB (ref 65–99)
Potassium: 4.2 mmol/L (ref 3.5–5.1)
Sodium: 136 mmol/L (ref 135–145)
TOTAL PROTEIN: 6.5 g/dL (ref 6.5–8.1)

## 2017-08-15 LAB — CBC WITH DIFFERENTIAL/PLATELET
BASOS ABS: 0 10*3/uL (ref 0.0–0.1)
BASOS PCT: 0 %
Eosinophils Absolute: 0.4 10*3/uL (ref 0.0–0.7)
Eosinophils Relative: 5 %
HEMATOCRIT: 31.4 % — AB (ref 39.0–52.0)
HEMOGLOBIN: 10.5 g/dL — AB (ref 13.0–17.0)
LYMPHS PCT: 17 %
Lymphs Abs: 1.4 10*3/uL (ref 0.7–4.0)
MCH: 30.4 pg (ref 26.0–34.0)
MCHC: 33.4 g/dL (ref 30.0–36.0)
MCV: 91 fL (ref 78.0–100.0)
MONO ABS: 1.2 10*3/uL — AB (ref 0.1–1.0)
Monocytes Relative: 14 %
NEUTROS ABS: 5.3 10*3/uL (ref 1.7–7.7)
NEUTROS PCT: 64 %
Platelets: 149 10*3/uL — ABNORMAL LOW (ref 150–400)
RBC: 3.45 MIL/uL — ABNORMAL LOW (ref 4.22–5.81)
RDW: 13.4 % (ref 11.5–15.5)
WBC: 8.3 10*3/uL (ref 4.0–10.5)

## 2017-08-15 LAB — VALPROIC ACID LEVEL: VALPROIC ACID LVL: 30 ug/mL — AB (ref 50.0–100.0)

## 2017-08-15 NOTE — ED Notes (Signed)
Bed: ZO10 Expected date:  Expected time:  Means of arrival:  Comments: 89 m fall

## 2017-08-15 NOTE — ED Provider Notes (Signed)
[facility]-EMERGENCY DEPT Provider Note   CSN: [id number] Arrival date & time: 08/15/17  0116     History   Chief Complaint Chief Complaint  Patient presents with  . Fall  . Shoulder Pain    HPI [name] is a 83 y.o. male.  HPI Level V caveat due to dementia. Patient brought in by EMS after unwitnessed fall. Staff found the patient on the floor beside his bed. Past Medical History:  Diagnosis Date  . Anemia   . Aortic stenosis   . Arthritis   . Dementia   . HOH (hard of hearing)   . Hyperlipidemia   . Hypertension   . Renal insufficiency   . Type 2 diabetes mellitus (HCC)    borderline    Patient Active Problem List   Diagnosis Date Noted  . Dementia with behavioral disturbance 07/28/2017  . Age-related cognitive decline 09/25/2016  . AORTIC VALVE REPLACEMENT, HX OF 01/15/2010  . ATRIAL FIBRILLATION 10/07/2009  . RENAL INSUFFICIENCY 08/14/2009  . CONGENITAL FACTOR VIII DISORDER 08/07/2009  . ACUTE RHEUMATIC ENDOCARDITIS 08/07/2009  . CORONARY ATHEROSCLEROSIS NATIVE CORONARY ARTERY 08/07/2009  . DM 06/10/2009  . HYPERCHOLESTEROLEMIA, PURE 11/20/2008  . HYPERTENSION, BENIGN 11/20/2008  . AORTIC STENOSIS/ INSUFFICIENCY, NON-RHEUMATIC 11/20/2008    Past Surgical History:  Procedure Laterality Date  . AORTIC VALVE REPLACEMENT  November 2010  . BACK SURGERY    . KNEE SURGERY     Left Knee  . OLECRANON BURSECTOMY  04/04/2012   Procedure: OLECRANON BURSA;  Surgeon: Tami Ribas, MD;  Location: [facility];  Service: Orthopedics;  Laterality: Left;  left elbow bursa I&D  . TONSILLECTOMY         Home Medications    Prior to Admission medications   Medication Sig Start Date End Date Taking? Authorizing Provider  aspirin 81 MG tablet Take 81 mg by mouth daily.    Yes [provider]  divalproex (DEPAKOTE SPRINKLE) 125 MG capsule Take 250 mg by mouth 3 (three) times daily. 07/22/17  Yes [provider]  docusate sodium (COLACE)  100 MG capsule Take 100 mg by mouth daily.   Yes [provider]  ferrous sulfate 325 (65 FE) MG EC tablet Take 325 mg by mouth 2 (two) times daily.   Yes [provider]  hydrOXYzine (ATARAX/VISTARIL) 10 MG tablet Take 1 tablet (10 mg total) by mouth 3 (three) times daily. 07/28/17  Yes Lord, Herminio Heads, NP  LORazepam (ATIVAN) 0.5 MG tablet Take 0.25 tablets by mouth daily. 07/26/17  Yes [provider]  metoprolol succinate (TOPROL-XL) 25 MG 24 hr tablet Take 12.5 mg by mouth daily. 07/19/17  Yes [provider]  PARoxetine (PAXIL) 10 MG tablet Take 10 mg by mouth at bedtime.   Yes [provider]  risperiDONE (RISPERDAL) 1 MG/ML oral solution Take 0.3 mLs (0.3 mg total) by mouth 2 (two) times daily. 07/28/17  Yes Charm Rings, NP  Skin Protectants, Misc. (EUCERIN) cream Apply 1 application topically 2 (two) times daily.   Yes [provider]  torsemide (DEMADEX) 20 MG tablet Take 20 mg by mouth daily.   Yes [provider]  HYDROcodone-acetaminophen (NORCO) 5-325 MG per tablet 1-2 tabs po q6 hours prn pain Patient not taking: Reported on 09/24/2016 04/04/12   Betha Loa, MD  metoprolol tartrate (LOPRESSOR) 25 MG tablet TAKE 1 TABLET BY MOUTH EVERY DAY Patient not taking: Reported on 07/26/2017 02/12/12   Lewayne Bunting, MD  Family History History reviewed. No pertinent family history.  Social History Social History  Substance Use Topics  . Smoking status: Never Smoker  . Smokeless tobacco: Never Used  . Alcohol use No     Allergies   Patient has no known allergies.   Review of Systems Review of Systems  Unable to perform ROS: Dementia     Physical Exam Updated Vital Signs BP (!) 127/57   Pulse 78   Temp 98.1 F (36.7 C) (Oral)   Resp 18   SpO2 96%   Physical Exam  Constitutional: He appears well-developed and well-nourished.  HENT:  Head: Normocephalic and atraumatic.  Mouth/Throat: Oropharynx is  clear and moist.  No obvious head trauma  Eyes: Pupils are equal, round, and reactive to light. EOM are normal.  Neck: Normal range of motion. Neck supple.  No definite midline cervical tenderness to palpation.  Cardiovascular: Normal rate and regular rhythm.   Pulmonary/Chest: Effort normal and breath sounds normal.  Abdominal: Soft. Bowel sounds are normal. There is no tenderness. There is no rebound and no guarding.  Musculoskeletal: Normal range of motion. He exhibits edema. He exhibits no tenderness.  Bilateral lower extremity edema. Pelvis is stable. Normal range of motion of all joints without obvious pain. No obvious deformity.   Neurological:  Drowsy but arousable. Not questions or following commands.  Skin: Skin is warm and dry. Capillary refill takes less than 2 seconds. No rash noted. No erythema.  Nursing note and vitals reviewed.    ED Treatments / Results  Labs (all labs ordered are listed, but only abnormal results are displayed) Labs Reviewed  CBC WITH DIFFERENTIAL/PLATELET - Abnormal; Notable for the following:       Result Value   RBC 3.45 (*)    Hemoglobin 10.5 (*)    HCT 31.4 (*)    Platelets 149 (*)    Monocytes Absolute 1.2 (*)    All other components within normal limits  COMPREHENSIVE METABOLIC PANEL - Abnormal; Notable for the following:    Glucose, Bld 124 (*)    BUN 41 (*)    Creatinine, Ser 2.23 (*)    ALT 16 (*)    GFR calc non Af Amer 24 (*)    GFR calc Af Amer 28 (*)    All other components within normal limits  VALPROIC ACID LEVEL - Abnormal; Notable for the following:    Valproic Acid Lvl 30 (*)    All other components within normal limits    EKG  EKG Interpretation None       Radiology Ct Head Wo Contrast  Result Date: 08/15/2017 CLINICAL DATA:  Unwitnessed fall. EXAM: CT HEAD WITHOUT CONTRAST CT CERVICAL SPINE WITHOUT CONTRAST TECHNIQUE: Multidetector CT imaging of the head and cervical spine was performed following the standard  protocol without intravenous contrast. Multiplanar CT image reconstructions of the cervical spine were also generated. COMPARISON:  None. FINDINGS: CT HEAD FINDINGS Brain: Diffuse cerebral atrophy. Ventricular dilatation consistent with central atrophy. Low-attenuation changes in the deep white matter consistent small vessel ischemia. No evidence of acute infarction, hemorrhage, hydrocephalus, extra-axial collection or mass lesion/mass effect. Vascular: Vascular calcifications are present. Skull: Calvarium appears intact. Sinuses/Orbits: Paranasal sinuses and mastoid air cells are clear. Other: None. CT CERVICAL SPINE FINDINGS Alignment: Normal. Skull base and vertebrae: No acute fracture. No primary bone lesion or focal pathologic process. Soft tissues and spinal canal: No prevertebral fluid or swelling. No visible canal hematoma. Disc levels: Degenerative changes throughout the cervical spine  with narrowed interspaces and endplate hypertrophic changes throughout. Upper chest: Emphysematous changes and fibrosis in the lung apices. Aortic calcification. Other: None. IMPRESSION: 1. No acute intracranial abnormalities. Chronic atrophy and small vessel ischemic changes. 2. Normal alignment of the cervical spine. Diffuse degenerative changes. No acute displaced fractures identified. Electronically Signed   By: Burman Nieves M.D.   On: 08/15/2017 03:30   Ct Cervical Spine Wo Contrast  Result Date: 08/15/2017 CLINICAL DATA:  Unwitnessed fall. EXAM: CT HEAD WITHOUT CONTRAST CT CERVICAL SPINE WITHOUT CONTRAST TECHNIQUE: Multidetector CT imaging of the head and cervical spine was performed following the standard protocol without intravenous contrast. Multiplanar CT image reconstructions of the cervical spine were also generated. COMPARISON:  None. FINDINGS: CT HEAD FINDINGS Brain: Diffuse cerebral atrophy. Ventricular dilatation consistent with central atrophy. Low-attenuation changes in the deep white matter  consistent small vessel ischemia. No evidence of acute infarction, hemorrhage, hydrocephalus, extra-axial collection or mass lesion/mass effect. Vascular: Vascular calcifications are present. Skull: Calvarium appears intact. Sinuses/Orbits: Paranasal sinuses and mastoid air cells are clear. Other: None. CT CERVICAL SPINE FINDINGS Alignment: Normal. Skull base and vertebrae: No acute fracture. No primary bone lesion or focal pathologic process. Soft tissues and spinal canal: No prevertebral fluid or swelling. No visible canal hematoma. Disc levels: Degenerative changes throughout the cervical spine with narrowed interspaces and endplate hypertrophic changes throughout. Upper chest: Emphysematous changes and fibrosis in the lung apices. Aortic calcification. Other: None. IMPRESSION: 1. No acute intracranial abnormalities. Chronic atrophy and small vessel ischemic changes. 2. Normal alignment of the cervical spine. Diffuse degenerative changes. No acute displaced fractures identified. Electronically Signed   By: Burman Nieves M.D.   On: 08/15/2017 03:30    Procedures Procedures (including critical care time)  Medications Ordered in ED Medications - No data to display   Initial Impression / Assessment and Plan / ED Course  I have reviewed the triage vital signs and the nursing notes.  Pertinent labs & imaging results that were available during my care of the patient were reviewed by me and considered in my medical decision making (see chart for details).     Per nursing sounds staff patient is at his baseline mental status. No intracranial injury. Will transfer back to nursing home.  Final Clinical Impressions(s) / ED Diagnoses   Final diagnoses:  Unwitnessed fall    New Prescriptions New Prescriptions   No medications on file     Loren Racer, MD 08/15/17 [phone]

## 2017-08-15 NOTE — ED Triage Notes (Signed)
Brought in by EMS from [facility] with c/o right shoulder pain after his unwitnessed fall tonight.  Pt was found lying on floor beside his bed---- pt c/o right shoulder pain.  Pt also sustained "bruise" on the right flank area and a skin tear on his right elbow, bleeding controlled.

## 2017-08-15 NOTE — ED Notes (Signed)
[facility] was called for pt's transportation back to [facility]. 

## 2017-08-15 NOTE — ED Notes (Signed)
[employer] staff was called and was given report on pt's condition and discharge.

## 2017-08-18 ENCOUNTER — Emergency Department (HOSPITAL_COMMUNITY): Payer: Medicare Other

## 2017-08-18 ENCOUNTER — Emergency Department (HOSPITAL_COMMUNITY)
Admission: EM | Admit: 2017-08-18 | Discharge: 2017-08-18 | Disposition: A | Discharge status: Home / Self Care | Payer: Medicare Other | Attending: Emergency Medicine | Admitting: Emergency Medicine

## 2017-08-18 ENCOUNTER — Encounter (HOSPITAL_COMMUNITY): Payer: Self-pay | Admitting: Emergency Medicine

## 2017-08-18 DIAGNOSIS — Y9301 Activity, walking, marching and hiking: Secondary | ICD-10-CM | POA: Insufficient documentation

## 2017-08-18 DIAGNOSIS — S0181XA Laceration without foreign body of other part of head, initial encounter: Secondary | ICD-10-CM | POA: Insufficient documentation

## 2017-08-18 DIAGNOSIS — Z79899 Other long term (current) drug therapy: Secondary | ICD-10-CM | POA: Insufficient documentation

## 2017-08-18 DIAGNOSIS — Y92129 Unspecified place in nursing home as the place of occurrence of the external cause: Secondary | ICD-10-CM | POA: Diagnosis not present

## 2017-08-18 DIAGNOSIS — W19XXXA Unspecified fall, initial encounter: Secondary | ICD-10-CM

## 2017-08-18 DIAGNOSIS — Y999 Unspecified external cause status: Secondary | ICD-10-CM | POA: Insufficient documentation

## 2017-08-18 DIAGNOSIS — S0990XA Unspecified injury of head, initial encounter: Secondary | ICD-10-CM

## 2017-08-18 DIAGNOSIS — F0391 Unspecified dementia with behavioral disturbance: Secondary | ICD-10-CM | POA: Insufficient documentation

## 2017-08-18 DIAGNOSIS — W01198A Fall on same level from slipping, tripping and stumbling with subsequent striking against other object, initial encounter: Secondary | ICD-10-CM | POA: Diagnosis not present

## 2017-08-18 DIAGNOSIS — I10 Essential (primary) hypertension: Secondary | ICD-10-CM | POA: Insufficient documentation

## 2017-08-18 MED ORDER — LIDOCAINE-EPINEPHRINE (PF) 2 %-1:200000 IJ SOLN
10.0000 mL | Freq: Once | INTRAMUSCULAR | Status: AC
  Administered 2017-08-18: 10 mL
  Filled 2017-08-18: qty 20

## 2017-08-18 NOTE — ED Provider Notes (Signed)
[facility]-EMERGENCY DEPT Provider Note   CSN: [id number] Arrival date & time: 08/18/17  1023     History   Chief Complaint Chief Complaint  Patient presents with  . Fall    HPI [name] is a 83 y.o. male.  Patient is a 83 year old male who lives in a nursing facility. He has a history of dementia, aortic stenosis, hyperlipidemia, hypertension and diabetes. He reportedly had a fall at the nursing facility today. It was reported that he was walking to the bathroom and tripped and fell forward hitting his head on the door. There is no reported loss of consciousness. He's alert and has baseline mental status. He has a laceration noted to his left forehead. There are no other reported injuries during history is limited due to the patient's dementia.  He is on a baby aspirin but no other reported anticoagulants.      Past Medical History:  Diagnosis Date  . Anemia   . Aortic stenosis   . Arthritis   . Dementia   . HOH (hard of hearing)   . Hyperlipidemia   . Hypertension   . Renal insufficiency   . Type 2 diabetes mellitus (HCC)    borderline    Patient Active Problem List   Diagnosis Date Noted  . Dementia with behavioral disturbance 07/28/2017  . Age-related cognitive decline 09/25/2016  . AORTIC VALVE REPLACEMENT, HX OF 01/15/2010  . ATRIAL FIBRILLATION 10/07/2009  . RENAL INSUFFICIENCY 08/14/2009  . CONGENITAL FACTOR VIII DISORDER 08/07/2009  . ACUTE RHEUMATIC ENDOCARDITIS 08/07/2009  . CORONARY ATHEROSCLEROSIS NATIVE CORONARY ARTERY 08/07/2009  . DM 06/10/2009  . HYPERCHOLESTEROLEMIA, PURE 11/20/2008  . HYPERTENSION, BENIGN 11/20/2008  . AORTIC STENOSIS/ INSUFFICIENCY, NON-RHEUMATIC 11/20/2008    Past Surgical History:  Procedure Laterality Date  . AORTIC VALVE REPLACEMENT  November 2010  . BACK SURGERY    . KNEE SURGERY     Left Knee  . OLECRANON BURSECTOMY  04/04/2012   Procedure: OLECRANON BURSA;  Surgeon: Tami Ribas, MD;  Location: [facility];  Service: Orthopedics;  Laterality: Left;  left elbow bursa I&D  . TONSILLECTOMY         Home Medications    Prior to Admission medications   Medication Sig Start Date End Date Taking? Authorizing Provider  aspirin 81 MG tablet Take 81 mg by mouth daily.    Yes [provider]  clonazePAM (KLONOPIN) 0.5 MG tablet Take 0.5 mg by mouth 2 (two) times daily.   Yes [provider]  clonazePAM (KLONOPIN) 1 MG tablet Take 1 mg by mouth at bedtime.   Yes [provider]  divalproex (DEPAKOTE SPRINKLE) 125 MG capsule Take 125 mg by mouth 3 (three) times daily. 08/16/17  Yes [provider]  docusate sodium (COLACE) 100 MG capsule Take 100 mg by mouth daily.   Yes [provider]  ferrous sulfate 325 (65 FE) MG EC tablet Take 325 mg by mouth 2 (two) times daily.   Yes [provider]  hydrOXYzine (ATARAX/VISTARIL) 10 MG tablet Take 1 tablet (10 mg total) by mouth 3 (three) times daily. 07/28/17  Yes Charm Rings, NP  metoprolol succinate (TOPROL-XL) 25 MG 24 hr tablet Take 12.5 mg by mouth daily. 07/19/17  Yes [provider]  PARoxetine (PAXIL) 10 MG tablet Take 10 mg by mouth at bedtime.   Yes [provider]  risperiDONE (RISPERDAL) 1 MG/ML oral solution Take 0.3 mLs (0.3 mg total) by mouth 2 (two) times  daily. 07/28/17  Yes Charm Rings, NP  Skin Protectants, Misc. (EUCERIN) cream Apply 1 application topically 2 (two) times daily.   Yes [provider]  torsemide (DEMADEX) 20 MG tablet Take 40 mg by mouth daily.    Yes [provider]  traZODone (DESYREL) 100 MG tablet Take 100 mg by mouth at bedtime. 08/16/17  Yes [provider]  metoprolol tartrate (LOPRESSOR) 25 MG tablet TAKE 1 TABLET BY MOUTH EVERY DAY Patient not taking: Reported on 07/26/2017 02/12/12   Lewayne Bunting, MD    Family History No family history on file.  Social History Social History  Substance Use Topics   . Smoking status: Never Smoker  . Smokeless tobacco: Never Used  . Alcohol use No     Allergies   Patient has no known allergies.   Review of Systems Review of Systems  Unable to perform ROS: Dementia     Physical Exam Updated Vital Signs BP (!) 148/60 (BP Location: Right Arm)   Pulse (!) 57   Temp 98.3 F (36.8 C) (Oral)   Resp 16   SpO2 99%   Physical Exam  Constitutional: He appears well-developed and well-nourished.  HENT:  Head: Normocephalic.  Patient has a 2 some malar laceration to his left forehead just superior and lateral to the eyebrow.  No eye redness or other apparent eye involvement.  Eyes: Pupils are equal, round, and reactive to light.  Neck: Normal range of motion. Neck supple.  Cardiovascular: Normal rate, regular rhythm and normal heart sounds.   Pulmonary/Chest: Effort normal and breath sounds normal. No respiratory distress. He has no wheezes. He has no rales. He exhibits no tenderness.  Abdominal: Soft. Bowel sounds are normal. There is no tenderness. There is no rebound and no guarding.  Musculoskeletal: Normal range of motion. He exhibits no edema.  No pain on palpation or range of motion of the extremities, including the hips. No pain to the cervical thoracic or lumbosacral spine.  Lymphadenopathy:    He has no cervical adenopathy.  Neurological: He is alert.  He is alert but confused. He is awake and answering questions. He's moving all extremities symmetrically  Skin: Skin is warm and dry. No rash noted.  He has a skin tear to his left elbow with a dressing in place. There is no underlying bony tenderness. No pain on range of motion the elbow  Psychiatric: He has a normal mood and affect.     ED Treatments / Results  Labs (all labs ordered are listed, but only abnormal results are displayed) Labs Reviewed - No data to display  EKG  EKG Interpretation None       Radiology Ct Head Wo Contrast  Result Date: 08/18/2017 CLINICAL  DATA:  Trip and fall today wall going to the restroom. Left facial laceration. Initial encounter. EXAM: CT HEAD WITHOUT CONTRAST CT CERVICAL SPINE WITHOUT CONTRAST TECHNIQUE: Multidetector CT imaging of the head and cervical spine was performed following the standard protocol without intravenous contrast. Multiplanar CT image reconstructions of the cervical spine were also generated. COMPARISON:  Three days prior FINDINGS: CT HEAD FINDINGS Brain: No evidence of acute infarction, hemorrhage, hydrocephalus, extra-axial collection or mass lesion/mass effect. Advanced chronic small vessel ischemia with confluent gliosis in the cerebral white matter. Remote small vessel infarcts in the bilateral thalamus and upper cerebellum. Generalized atrophy, moderate to advanced. Vascular: Atherosclerotic calcification.  No hyperdense vessel. Skull: Negative for fracture. Sinuses/Orbits: No evidence of injury. CT CERVICAL SPINE FINDINGS  Alignment: No traumatic malalignment. Skull base and vertebrae: Negative for acute fracture. There is T2 superior endplate horizontal sclerosis consistent with remote injury, stable. Soft tissues and spinal canal: No prevertebral fluid or swelling. No visible canal hematoma. Carotid atherosclerosis. Disc levels: Generalized degenerative disc narrowing. Notable posterior disc osteophyte complex that C4-5 and C5-6, impinging on the ventral cord. Upper chest: No acute finding IMPRESSION: 1. No evidence of acute intracranial or cervical spine injury. 2. Prominent atrophy and chronic small vessel ischemic injury. 3. 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 Electronically Signed   By: Marnee Spring M.D.   On: 08/18/2017 11:46   Ct Cervical Spine Wo Contrast  Result Date: 08/18/2017 CLINICAL DATA:  Trip and fall today wall going to the restroom. Left facial laceration. Initial encounter. EXAM: CT HEAD WITHOUT CONTRAST CT CERVICAL SPINE WITHOUT CONTRAST TECHNIQUE:  Multidetector CT imaging of the head and cervical spine was performed following the standard protocol without intravenous contrast. Multiplanar CT image reconstructions of the cervical spine were also generated. COMPARISON:  Three days prior FINDINGS: CT HEAD FINDINGS Brain: No evidence of acute infarction, hemorrhage, hydrocephalus, extra-axial collection or mass lesion/mass effect. Advanced chronic small vessel ischemia with confluent gliosis in the cerebral white matter. Remote small vessel infarcts in the bilateral thalamus and upper cerebellum. Generalized atrophy, moderate to advanced. Vascular: Atherosclerotic calcification.  No hyperdense vessel. Skull: Negative for fracture. Sinuses/Orbits: No evidence of injury. CT CERVICAL SPINE FINDINGS Alignment: No traumatic malalignment. Skull base and vertebrae: Negative for acute fracture. There is T2 superior endplate horizontal sclerosis consistent with remote injury, stable. Soft tissues and spinal canal: No prevertebral fluid or swelling. No visible canal hematoma. Carotid atherosclerosis. Disc levels: Generalized degenerative disc narrowing. Notable posterior disc osteophyte complex that C4-5 and C5-6, impinging on the ventral cord. Upper chest: No acute finding IMPRESSION: 1. No evidence of acute intracranial or cervical spine injury. 2. Prominent atrophy and chronic small vessel ischemic injury. 3. 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 Electronically Signed   By: Marnee Spring M.D.   On: 08/18/2017 11:46    Procedures .Marland KitchenLaceration Repair Date/Time: 08/18/2017 12:43 PM Performed by: Kylee Umana Authorized by: Rolan Bucco   Consent:    Consent obtained:  Emergent situation Anesthesia (see MAR for exact dosages):    Anesthesia method:  Local infiltration   Local anesthetic:  Lidocaine 2% WITH epi Laceration details:    Location:  Face   Face location:  Forehead   Length (cm):  2 Repair type:    Repair type:   Simple Pre-procedure details:    Preparation:  Patient was prepped and draped in usual sterile fashion and imaging obtained to evaluate for foreign bodies Exploration:    Wound exploration: entire depth of wound probed and visualized     Contaminated: no   Treatment:    Area cleansed with:  Saline   Amount of cleaning:  Standard   Irrigation solution:  Sterile saline   Irrigation method:  Syringe   Visualized foreign bodies/material removed: no   Skin repair:    Repair method:  Sutures   Suture size:  5-0   Suture material:  Prolene   Suture technique:  Simple interrupted   Number of sutures:  4 Approximation:    Approximation:  Close   Vermilion border: well-aligned   Post-procedure details:    Dressing:  Open (no dressing)   Patient tolerance of procedure:  Tolerated well, no immediate complications   (including  critical care time)  Medications Ordered in ED Medications  lidocaine-EPINEPHrine (XYLOCAINE W/EPI) 2 %-1:200000 (PF) injection 10 mL (10 mLs Infiltration Given 08/18/17 1201)     Initial Impression / Assessment and Plan / ED Course  I have reviewed the triage vital signs and the nursing notes.  Pertinent labs & imaging results that were available during my care of the patient were reviewed by me and considered in my medical decision making (see chart for details).     Patient presents after mechanical fall. He is at baseline mental status. CT scans don't show any acute abnormalities of the head or cervical spine. There is no other apparent injuries other than the laceration which was repaired in the ED.On review of records, his tetanus immunization appears to be up-to-date. He was discharged back to his nursing facility in good condition. He was discharged with wound care instructions and instructions to have the sutures removed in one week. Return precautions were given.  Final Clinical Impressions(s) / ED Diagnoses   Final diagnoses:  Fall, initial encounter    Injury of head, initial encounter  Facial laceration, initial encounter    New Prescriptions New Prescriptions   No medications on file     Rolan Bucco, MD 08/18/17 1245

## 2017-08-18 NOTE — ED Notes (Signed)
[facility] called  

## 2017-08-18 NOTE — ED Notes (Signed)
Bed: WA06 Expected date:  Expected time:  Means of arrival:  Comments: EMS fall nursing home

## 2017-08-18 NOTE — ED Notes (Signed)
Report given to facility [facility])- no answer voicemail left.

## 2017-08-18 NOTE — ED Triage Notes (Signed)
Patient here from [facility] with complaints of trip and fall today while going to restroom. Hit head on door. Laceration noted to left face. Bleeding controlled.

## 2017-08-27 ENCOUNTER — Emergency Department (HOSPITAL_COMMUNITY): Payer: Medicare Other

## 2017-08-27 ENCOUNTER — Encounter (HOSPITAL_COMMUNITY): Payer: Self-pay

## 2017-08-27 ENCOUNTER — Inpatient Hospital Stay (HOSPITAL_COMMUNITY)
Admission: EM | Admit: 2017-08-27 | Discharge: 2017-08-29 | DRG: 947 | Disposition: A | Discharge status: Hospice | Payer: Medicare Other | Attending: Internal Medicine | Admitting: Internal Medicine

## 2017-08-27 DIAGNOSIS — E119 Type 2 diabetes mellitus without complications: Secondary | ICD-10-CM

## 2017-08-27 DIAGNOSIS — Z7982 Long term (current) use of aspirin: Secondary | ICD-10-CM

## 2017-08-27 DIAGNOSIS — S7001XA Contusion of right hip, initial encounter: Secondary | ICD-10-CM | POA: Diagnosis present

## 2017-08-27 DIAGNOSIS — R231 Pallor: Secondary | ICD-10-CM | POA: Diagnosis present

## 2017-08-27 DIAGNOSIS — E118 Type 2 diabetes mellitus with unspecified complications: Secondary | ICD-10-CM

## 2017-08-27 DIAGNOSIS — N184 Chronic kidney disease, stage 4 (severe): Secondary | ICD-10-CM

## 2017-08-27 DIAGNOSIS — E1122 Type 2 diabetes mellitus with diabetic chronic kidney disease: Secondary | ICD-10-CM | POA: Diagnosis present

## 2017-08-27 DIAGNOSIS — Z66 Do not resuscitate: Secondary | ICD-10-CM | POA: Diagnosis present

## 2017-08-27 DIAGNOSIS — E7800 Pure hypercholesterolemia, unspecified: Secondary | ICD-10-CM | POA: Diagnosis present

## 2017-08-27 DIAGNOSIS — H9190 Unspecified hearing loss, unspecified ear: Secondary | ICD-10-CM | POA: Diagnosis present

## 2017-08-27 DIAGNOSIS — W19XXXA Unspecified fall, initial encounter: Secondary | ICD-10-CM

## 2017-08-27 DIAGNOSIS — I129 Hypertensive chronic kidney disease with stage 1 through stage 4 chronic kidney disease, or unspecified chronic kidney disease: Secondary | ICD-10-CM | POA: Diagnosis present

## 2017-08-27 DIAGNOSIS — Z953 Presence of xenogenic heart valve: Secondary | ICD-10-CM | POA: Diagnosis not present

## 2017-08-27 DIAGNOSIS — J189 Pneumonia, unspecified organism: Secondary | ICD-10-CM

## 2017-08-27 DIAGNOSIS — Z954 Presence of other heart-valve replacement: Secondary | ICD-10-CM | POA: Diagnosis not present

## 2017-08-27 DIAGNOSIS — E870 Hyperosmolality and hypernatremia: Secondary | ICD-10-CM | POA: Diagnosis present

## 2017-08-27 DIAGNOSIS — D649 Anemia, unspecified: Secondary | ICD-10-CM | POA: Diagnosis not present

## 2017-08-27 DIAGNOSIS — E860 Dehydration: Secondary | ICD-10-CM | POA: Diagnosis present

## 2017-08-27 DIAGNOSIS — G9341 Metabolic encephalopathy: Secondary | ICD-10-CM | POA: Diagnosis present

## 2017-08-27 DIAGNOSIS — Z7189 Other specified counseling: Secondary | ICD-10-CM

## 2017-08-27 DIAGNOSIS — R680 Hypothermia, not associated with low environmental temperature: Secondary | ICD-10-CM | POA: Diagnosis not present

## 2017-08-27 DIAGNOSIS — E785 Hyperlipidemia, unspecified: Secondary | ICD-10-CM | POA: Diagnosis present

## 2017-08-27 DIAGNOSIS — I2510 Atherosclerotic heart disease of native coronary artery without angina pectoris: Secondary | ICD-10-CM | POA: Diagnosis present

## 2017-08-27 DIAGNOSIS — D638 Anemia in other chronic diseases classified elsewhere: Secondary | ICD-10-CM | POA: Diagnosis present

## 2017-08-27 DIAGNOSIS — M1990 Unspecified osteoarthritis, unspecified site: Secondary | ICD-10-CM | POA: Diagnosis present

## 2017-08-27 DIAGNOSIS — S40812A Abrasion of left upper arm, initial encounter: Secondary | ICD-10-CM | POA: Diagnosis present

## 2017-08-27 DIAGNOSIS — R296 Repeated falls: Secondary | ICD-10-CM | POA: Diagnosis not present

## 2017-08-27 DIAGNOSIS — F0391 Unspecified dementia with behavioral disturbance: Secondary | ICD-10-CM | POA: Diagnosis present

## 2017-08-27 DIAGNOSIS — Z79899 Other long term (current) drug therapy: Secondary | ICD-10-CM

## 2017-08-27 DIAGNOSIS — W010XXA Fall on same level from slipping, tripping and stumbling without subsequent striking against object, initial encounter: Secondary | ICD-10-CM | POA: Diagnosis present

## 2017-08-27 DIAGNOSIS — T68XXXA Hypothermia, initial encounter: Secondary | ICD-10-CM | POA: Diagnosis present

## 2017-08-27 DIAGNOSIS — T68XXXD Hypothermia, subsequent encounter: Secondary | ICD-10-CM | POA: Diagnosis not present

## 2017-08-27 LAB — LACTIC ACID, PLASMA
Lactic Acid, Venous: 1 mmol/L (ref 0.5–1.9)
Lactic Acid, Venous: 1 mmol/L (ref 0.5–1.9)

## 2017-08-27 LAB — CBC WITH DIFFERENTIAL/PLATELET
BASOS ABS: 0 10*3/uL (ref 0.0–0.1)
BASOS PCT: 0 %
Eosinophils Absolute: 0.2 10*3/uL (ref 0.0–0.7)
Eosinophils Relative: 3 %
HCT: 29.9 % — ABNORMAL LOW (ref 39.0–52.0)
Hemoglobin: 9.9 g/dL — ABNORMAL LOW (ref 13.0–17.0)
LYMPHS PCT: 15 %
Lymphs Abs: 1.3 10*3/uL (ref 0.7–4.0)
MCH: 31.2 pg (ref 26.0–34.0)
MCHC: 33.1 g/dL (ref 30.0–36.0)
MCV: 94.3 fL (ref 78.0–100.0)
MONO ABS: 1.1 10*3/uL — AB (ref 0.1–1.0)
MONOS PCT: 14 %
NEUTROS ABS: 5.6 10*3/uL (ref 1.7–7.7)
NEUTROS PCT: 68 %
PLATELETS: 163 10*3/uL (ref 150–400)
RBC: 3.17 MIL/uL — AB (ref 4.22–5.81)
RDW: 14 % (ref 11.5–15.5)
WBC: 8.3 10*3/uL (ref 4.0–10.5)

## 2017-08-27 LAB — COMPREHENSIVE METABOLIC PANEL
ALBUMIN: 3.8 g/dL (ref 3.5–5.0)
ALK PHOS: 88 U/L (ref 38–126)
ALT: 23 U/L (ref 17–63)
AST: 32 U/L (ref 15–41)
Anion gap: 11 (ref 5–15)
BILIRUBIN TOTAL: 0.5 mg/dL (ref 0.3–1.2)
BUN: 42 mg/dL — AB (ref 6–20)
CO2: 26 mmol/L (ref 22–32)
CREATININE: 2.28 mg/dL — AB (ref 0.61–1.24)
Calcium: 9.8 mg/dL (ref 8.9–10.3)
Chloride: 109 mmol/L (ref 101–111)
GFR calc Af Amer: 27 mL/min — ABNORMAL LOW (ref >60)
GFR calc non Af Amer: 23 mL/min — ABNORMAL LOW (ref >60)
GLUCOSE: 110 mg/dL — AB (ref 65–99)
POTASSIUM: 4.1 mmol/L (ref 3.5–5.1)
Sodium: 146 mmol/L — ABNORMAL HIGH (ref 135–145)
TOTAL PROTEIN: 6.8 g/dL (ref 6.5–8.1)

## 2017-08-27 LAB — VITAMIN B12: Vitamin B-12: 848 pg/mL (ref 180–914)

## 2017-08-27 LAB — URINALYSIS, ROUTINE W REFLEX MICROSCOPIC
BILIRUBIN URINE: NEGATIVE
Glucose, UA: NEGATIVE mg/dL
Hgb urine dipstick: NEGATIVE
KETONES UR: NEGATIVE mg/dL
Leukocytes, UA: NEGATIVE
Nitrite: NEGATIVE
Protein, ur: NEGATIVE mg/dL
SPECIFIC GRAVITY, URINE: 1.012 (ref 1.005–1.030)
pH: 5 (ref 5.0–8.0)

## 2017-08-27 LAB — C-REACTIVE PROTEIN: CRP: 2.5 mg/dL — ABNORMAL HIGH (ref <1.0)

## 2017-08-27 LAB — IRON AND TIBC
IRON: 52 ug/dL (ref 45–182)
Saturation Ratios: 21 % (ref 17.9–39.5)
TIBC: 253 ug/dL (ref 250–450)
UIBC: 201 ug/dL

## 2017-08-27 LAB — AMMONIA: AMMONIA: 12 umol/L (ref 9–35)

## 2017-08-27 LAB — TSH: TSH: 1.954 u[IU]/mL (ref 0.350–4.500)

## 2017-08-27 LAB — CK: Total CK: 586 U/L — ABNORMAL HIGH (ref 49–397)

## 2017-08-27 LAB — SEDIMENTATION RATE: SED RATE: 30 mm/h — AB (ref 0–16)

## 2017-08-27 LAB — FOLATE: Folate: 11.4 ng/mL (ref >5.9)

## 2017-08-27 LAB — FERRITIN: FERRITIN: 234 ng/mL (ref 24–336)

## 2017-08-27 LAB — CORTISOL: CORTISOL PLASMA: 17.9 ug/dL

## 2017-08-27 MED ORDER — SODIUM CHLORIDE 0.9 % IV BOLUS (SEPSIS)
1000.0000 mL | Freq: Once | INTRAVENOUS | Status: AC
  Administered 2017-08-27: 1000 mL via INTRAVENOUS

## 2017-08-27 MED ORDER — DIVALPROEX SODIUM 125 MG PO CSDR
250.0000 mg | DELAYED_RELEASE_CAPSULE | Freq: Two times a day (BID) | ORAL | Status: DC
  Filled 2017-08-27: qty 2

## 2017-08-27 MED ORDER — CLONAZEPAM 1 MG PO TABS: 1.0000 mg | ORAL_TABLET | Freq: Every day | ORAL | Status: DC

## 2017-08-27 MED ORDER — METOPROLOL SUCCINATE ER 25 MG PO TB24
12.5000 mg | ORAL_TABLET | Freq: Every day | ORAL | Status: DC
  Filled 2017-08-27: qty 1

## 2017-08-27 MED ORDER — DOCUSATE SODIUM 100 MG PO CAPS
100.0000 mg | ORAL_CAPSULE | Freq: Every day | ORAL | Status: DC
Start: 2017-08-28 — End: 2017-08-29
  Filled 2017-08-27: qty 1

## 2017-08-27 MED ORDER — FERROUS SULFATE 325 (65 FE) MG PO TABS
325.0000 mg | ORAL_TABLET | Freq: Two times a day (BID) | ORAL | Status: DC
  Filled 2017-08-27: qty 1

## 2017-08-27 MED ORDER — VALPROATE SODIUM 500 MG/5ML IV SOLN
250.0000 mg | Freq: Two times a day (BID) | INTRAVENOUS | Status: DC
  Administered 2017-08-27 – 2017-08-29 (×4): 250 mg via INTRAVENOUS
  Filled 2017-08-27 (×5): qty 2.5

## 2017-08-27 MED ORDER — HYDROXYZINE HCL 10 MG PO TABS
10.0000 mg | ORAL_TABLET | Freq: Three times a day (TID) | ORAL | Status: DC
  Filled 2017-08-27: qty 1

## 2017-08-27 MED ORDER — SODIUM CHLORIDE 0.9 % IV SOLN
INTRAVENOUS | Status: DC
  Administered 2017-08-27: 50 mL/h via INTRAVENOUS

## 2017-08-27 MED ORDER — CLONAZEPAM 0.5 MG PO TABS
0.5000 mg | ORAL_TABLET | Freq: Two times a day (BID) | ORAL | Status: DC
  Filled 2017-08-27: qty 1

## 2017-08-27 MED ORDER — TRAZODONE HCL 50 MG PO TABS: 100.0000 mg | ORAL_TABLET | Freq: Every day | ORAL | Status: DC

## 2017-08-27 MED ORDER — HEPARIN SODIUM (PORCINE) 5000 UNIT/ML IJ SOLN
5000.0000 [IU] | Freq: Three times a day (TID) | INTRAMUSCULAR | Status: DC
  Administered 2017-08-27 – 2017-08-29 (×3): 5000 [IU] via SUBCUTANEOUS
  Filled 2017-08-27 (×3): qty 1

## 2017-08-27 MED ORDER — TORSEMIDE 20 MG PO TABS
40.0000 mg | ORAL_TABLET | Freq: Every day | ORAL | Status: DC
  Filled 2017-08-27 (×2): qty 2

## 2017-08-27 MED ORDER — RISPERIDONE 0.5 MG PO TABS
0.2500 mg | ORAL_TABLET | Freq: Two times a day (BID) | ORAL | Status: DC
  Filled 2017-08-27: qty 1

## 2017-08-27 MED ORDER — PAROXETINE HCL 20 MG PO TABS: 10.0000 mg | ORAL_TABLET | Freq: Every day | ORAL | Status: DC

## 2017-08-27 MED ORDER — ASPIRIN EC 81 MG PO TBEC
81.0000 mg | DELAYED_RELEASE_TABLET | Freq: Every day | ORAL | Status: DC
  Filled 2017-08-27: qty 1

## 2017-08-27 MED ORDER — LORAZEPAM 2 MG/ML IJ SOLN
0.5000 mg | Freq: Four times a day (QID) | INTRAMUSCULAR | Status: DC | PRN
  Administered 2017-08-27 – 2017-08-28 (×2): 0.5 mg via INTRAVENOUS
  Filled 2017-08-27 (×2): qty 1

## 2017-08-27 NOTE — ED Notes (Signed)
[facility] called for transport.  

## 2017-08-27 NOTE — ED Notes (Signed)
Bed: UJ81 Expected date:  Expected time:  Means of arrival:  Comments: 83 yo fall; dementia

## 2017-08-27 NOTE — Discharge Instructions (Signed)
There were no signs of serious trauma on your evaluation today. Your CT head and neck and Xray of the hip does not show injury.  Please return without fail for worsening symptoms, including fever, changes in mental status, or any other symptoms concerning to you.

## 2017-08-27 NOTE — ED Notes (Signed)
Patient transported to X-ray 

## 2017-08-27 NOTE — H&P (Addendum)
History and Physical    [name] FAO:[id number] DOB: [birth year] DOA: 08/27/2017  PCP: Shon Baton, MD  Patient coming from: [place] have personally briefly reviewed patient's old medical records in [place]  Chief Complaint: hypothermia  HPI: [name] is [name] 83 y.o. male with medical history significant of dementia, type 2 diabetes, chronic kidney disease, hypertension, hyperlipidemia, anemia who presented to the ED after being found down with an unwitnessed fall.  History is limited due to patient's dementia. He's been to the emergency department several times within the past few weeks due to falls. [facility] was requested to admit due to unexplained hypothermia.   At baseline the patient is [name] and O 1. On my discussion with staff from [facility] and his daughter, it seems like he has been declining over the past few weeks. He's had several presentations to the [facility] primary due to falls. He's also been seen due to increased agitation. The caregiver notes that this past Wednesday and Thursday he's been more difficult, wasn't following commands, and has had increasingly difficulty ambulating. His daughter notes that his mental status waxes and wanes, but is similar to those described above that he's [name]&O1. Today he is mumbling incoherently and she notes that is not out of the realm of normal, but at times he is more clear.  ED Course: The films, chest x-ray, CT head and C-spine. Labs. Rectal temperature 94.  Review of Systems: Unable to perform due to mental status.   Past Medical History:  Diagnosis Date  . Anemia   . Aortic stenosis   . Arthritis   . Dementia   . HOH (hard of hearing)   . Hyperlipidemia   . Hypertension   . Renal insufficiency   . Type 2 diabetes mellitus ([place])    borderline    Past Surgical History:  Procedure Laterality Date  . AORTIC VALVE REPLACEMENT  November 2010  . BACK SURGERY    . KNEE SURGERY     Left Knee  . OLECRANON  BURSECTOMY  04/04/2012   Procedure: OLECRANON BURSA;  Surgeon: Tennis Must, MD;  Location: [place];  Service: Orthopedics;  Laterality: Left;  left elbow bursa I&D  . TONSILLECTOMY       reports that he has never smoked. He has never used smokeless tobacco. He reports that he does not drink alcohol or use drugs.  No Known Allergies  No family history on file.  Prior to Admission medications   Medication Sig Start Date End Date Taking? Authorizing Provider  aspirin 81 MG tablet Take 81 mg by mouth daily.    Yes [provider]  clonazePAM (KLONOPIN) 0.5 MG tablet Take 0.5 mg by mouth 2 (two) times daily.   Yes [provider]  clonazePAM (KLONOPIN) 1 MG tablet Take 1 mg by mouth at bedtime.   Yes [provider]  divalproex (DEPAKOTE SPRINKLE) 125 MG capsule Take 250 mg by mouth 2 (two) times daily.  08/16/17  Yes [provider]  docusate sodium (COLACE) 100 MG capsule Take 100 mg by mouth daily.   Yes [provider]  ferrous sulfate 325 (65 FE) MG EC tablet Take 325 mg by mouth 2 (two) times daily.   Yes [provider]  hydrOXYzine (ATARAX/VISTARIL) 10 MG tablet Take 1 tablet (10 mg total) by mouth 3 (three) times daily. 07/28/17  Yes Patrecia Pour, NP  metoprolol succinate (TOPROL-XL) 25 MG 24 hr tablet  Take 12.5 mg by mouth daily. 07/19/17  Yes [provider]  PARoxetine (PAXIL) 10 MG tablet Take 10 mg by mouth at bedtime.   Yes [provider]  risperiDONE (RISPERDAL) 1 MG/ML oral solution Take 0.3 mLs (0.3 mg total) by mouth 2 (two) times daily. 07/28/17  Yes Patrecia Pour, NP  Skin Protectants, Misc. (EUCERIN) cream Apply 1 application topically 2 (two) times daily.   Yes [provider]  torsemide (DEMADEX) 20 MG tablet Take 40 mg by mouth daily.    Yes [provider]  traZODone (DESYREL) 100 MG tablet Take 100 mg by mouth at bedtime. 08/16/17  Yes [provider]     Physical Exam: Vitals:   08/27/17 0826 08/27/17 1005 08/27/17 1100 08/27/17 1215  BP: (!) 125/59  118/63 93/70  Pulse: 64  76 70  Resp: 16   18  Temp:  (!) 93.9 F (34.4 C)  (!) 94.7 F (34.8 C)  TempSrc:  Rectal  Rectal  SpO2: 100%  100% 100%    Constitutional: chronically ill appearing, frail, sleeping Vitals:   08/27/17 0826 08/27/17 1005 08/27/17 1100 08/27/17 1215  BP: (!) 125/59  118/63 93/70  Pulse: 64  76 70  Resp: 16   18  Temp:  (!) 93.9 F (34.4 C)  (!) 94.7 F (34.8 C)  TempSrc:  Rectal  Rectal  SpO2: 100%  100% 100%   Eyes: PERRL, lids and conjunctivae normal, eyes closed, but symmetric pupils ENMT: Mucous membranes are moist. Normal dentition.  Neck: normal, supple, no masses, no thyromegaly Respiratory: clear to auscultation bilaterally, no wheezing, no crackles. Normal respiratory effort. No accessory muscle use.  Cardiovascular: Regular rate and rhythm, no murmurs / rubs / gallops. No extremity edema. 2+ pedal pulses. No carotid bruits.  Abdomen: no tenderness, no masses palpated. No hepatosplenomegaly. Bowel sounds positive.  Musculoskeletal: no clubbing / cyanosis. No joint deformity upper and lower extremities. Good ROM, no contractures. Normal muscle tone.  Skin: Abrasions to L arm, suture over L eye Neurologic: Difficult as patient not following commands, but CN 2-12 grossly intact, moving all extremities Psychiatric: [name]&O x 0 , mumbling incoherently   Labs on Admission: I have personally reviewed following labs and imaging studies  CBC:  Recent Labs Lab 08/27/17 1030  WBC 8.3  NEUTROABS 5.6  HGB 9.9*  HCT 29.9*  MCV 94.3  PLT 742   Basic Metabolic Panel:  Recent Labs Lab 08/27/17 1030  NA 146*  K 4.1  CL 109  CO2 26  GLUCOSE 110*  BUN 42*  CREATININE 2.28*  CALCIUM 9.8   GFR: CrCl cannot be calculated (Unknown ideal weight.). Liver Function Tests:  Recent Labs Lab 08/27/17 1030  AST 32  ALT 23  ALKPHOS 88  BILITOT  0.5  PROT 6.8  ALBUMIN 3.8   No results for input(s): LIPASE, AMYLASE in the last 168 hours. No results for input(s): AMMONIA in the last 168 hours. Coagulation Profile: No results for input(s): INR, PROTIME in the last 168 hours. Cardiac Enzymes:  Recent Labs Lab 08/27/17 1030  CKTOTAL 586*   BNP (last 3 results) No results for input(s): PROBNP in the last 8760 hours. HbA1C: No results for input(s): HGBA1C in the last 72 hours. CBG: No results for input(s): GLUCAP in the last 168 hours. Lipid Profile: No results for input(s): CHOL, HDL, LDLCALC, TRIG, CHOLHDL, LDLDIRECT in the last 72 hours. Thyroid Function Tests:  Recent Labs  08/27/17 1030  TSH 1.954  Anemia Panel: No results for input(s): VITAMINB12, FOLATE, FERRITIN, TIBC, IRON, RETICCTPCT in the last 72 hours. Urine analysis:    Component Value Date/Time   COLORURINE YELLOW 08/27/2017 1210   APPEARANCEUR CLEAR 08/27/2017 1210   LABSPEC 1.012 08/27/2017 1210   PHURINE 5.0 08/27/2017 1210   GLUCOSEU NEGATIVE 08/27/2017 1210   HGBUR NEGATIVE 08/27/2017 1210   BILIRUBINUR NEGATIVE 08/27/2017 1210   KETONESUR NEGATIVE 08/27/2017 1210   PROTEINUR NEGATIVE 08/27/2017 1210   UROBILINOGEN 0.2 09/15/2009 1201   NITRITE NEGATIVE 08/27/2017 1210   LEUKOCYTESUR NEGATIVE 08/27/2017 1210    Radiological Exams on Admission: Ct Head Wo Contrast  Result Date: 08/27/2017 CLINICAL DATA:  Head trauma, found down, unwitnessed fall EXAM: CT HEAD WITHOUT CONTRAST CT CERVICAL SPINE WITHOUT CONTRAST TECHNIQUE: Multidetector CT imaging of the head and cervical spine was performed following the standard protocol without intravenous contrast. Multiplanar CT image reconstructions of the cervical spine were also generated. COMPARISON:  08/18/2017 FINDINGS: CT HEAD FINDINGS Brain: No evidence of acute infarction, hemorrhage, hydrocephalus, extra-axial collection or mass lesion/mass effect. Global cortical atrophy. Extensive subcortical  white matter and periventricular small vessel ischemic changes. Vascular: Intracranial atherosclerosis Skull: Normal. Negative for fracture or focal lesion. Sinuses/Orbits: The visualized paranasal sinuses are essentially clear. The mastoid air cells are unopacified. Other: None. CT CERVICAL SPINE FINDINGS Alignment: Normal cervical lordosis. Skull base and vertebrae: No acute fracture. No primary bone lesion or focal pathologic process. Soft tissues and spinal canal: No prevertebral fluid or swelling. No visible canal hematoma. Disc levels:  Mild multilevel degenerative changes. Upper chest: Visualized lung apices are clear. Other: Visualized thyroid is unremarkable. IMPRESSION: No evidence of acute intracranial abnormality. Atrophy with extensive small vessel ischemic changes. No evidence of traumatic injury to the cervical spine. Mild multilevel degenerative changes. Electronically Signed   By: Julian Hy M.D.   On: 08/27/2017 09:37   Ct Cervical Spine Wo Contrast  Result Date: 08/27/2017 CLINICAL DATA:  Head trauma, found down, unwitnessed fall EXAM: CT HEAD WITHOUT CONTRAST CT CERVICAL SPINE WITHOUT CONTRAST TECHNIQUE: Multidetector CT imaging of the head and cervical spine was performed following the standard protocol without intravenous contrast. Multiplanar CT image reconstructions of the cervical spine were also generated. COMPARISON:  08/18/2017 FINDINGS: CT HEAD FINDINGS Brain: No evidence of acute infarction, hemorrhage, hydrocephalus, extra-axial collection or mass lesion/mass effect. Global cortical atrophy. Extensive subcortical white matter and periventricular small vessel ischemic changes. Vascular: Intracranial atherosclerosis Skull: Normal. Negative for fracture or focal lesion. Sinuses/Orbits: The visualized paranasal sinuses are essentially clear. The mastoid air cells are unopacified. Other: None. CT CERVICAL SPINE FINDINGS Alignment: Normal cervical lordosis. Skull base and  vertebrae: No acute fracture. No primary bone lesion or focal pathologic process. Soft tissues and spinal canal: No prevertebral fluid or swelling. No visible canal hematoma. Disc levels:  Mild multilevel degenerative changes. Upper chest: Visualized lung apices are clear. Other: Visualized thyroid is unremarkable. IMPRESSION: No evidence of acute intracranial abnormality. Atrophy with extensive small vessel ischemic changes. No evidence of traumatic injury to the cervical spine. Mild multilevel degenerative changes. Electronically Signed   By: Julian Hy M.D.   On: 08/27/2017 09:37   Dg Chest Port 1 View  Result Date: 08/27/2017 CLINICAL DATA:  Shortness of breath. Hypothermia. Evaluate for pneumonia. EXAM: PORTABLE CHEST 1 VIEW COMPARISON:  07/27/2017; 10/13/2009 FINDINGS: Grossly unchanged cardiac silhouette and mediastinal contours with atherosclerotic plaque within the thoracic aorta. There is persistent thickening of the right paratracheal stripe, presumably secondary to prominent vasculature. Post median  sternotomy. No focal airspace opacities. No pleural effusion or pneumothorax. No evidence of edema. No acute osseus abnormalities. IMPRESSION: 1.  No acute cardiopulmonary disease. 2.  Aortic Atherosclerosis (ICD10-I70.0). Electronically Signed   By: Sandi Mariscal M.D.   On: 08/27/2017 11:04   Dg Hip Unilat W Or Wo Pelvis 2-3 Views Right  Result Date: 08/27/2017 CLINICAL DATA:  Patient found down this morning. Right hip pain. Initial encounter. EXAM: DG HIP (WITH OR WITHOUT PELVIS) 2-3V RIGHT COMPARISON:  None. FINDINGS: No acute bony or joint abnormality is identified. Lower lumbar degenerative change is seen. Atherosclerosis is noted. IMPRESSION: No acute abnormality. Lower lumbar degenerative disease. Atherosclerosis. Electronically Signed   By: Inge Rise M.D.   On: 08/27/2017 09:11    EKG: Independently reviewed. pending  Assessment/Plan Active Problems:    Hypothermia  Hypothermia:  Rectal temperature. Unclear etiology of this hypothermia. Normal white blood cell count. Normal thyroid. Cortisol is pending.  CXR without evidence of pneumonia, UA clean.   [facility] was unable to tell me of the temperature recently for him. Normal blood sugar here. I don't see obvious medications that are causing this, but beta blocker sometimes associated with hypothermia.  Depakote and risperdal also rarely associated.  Negative head CT.  Initially placed bed for stepdown (b/c can't used bair hugger on floor), but temperature improved and not requiring bare hugger anymore so will send to telemetry. - continue to monitor core temperatures, bair hugger prn hypothermia UA not c/w infection '[ ]'$  bcx, cortisol, utox '[ ]'$  CRP pending, lactic acid ESR is mildly elevated (normal for age) Continue metop/depakote/risperdal for now, consider d/c if recurrent hypothermia  Falls:  Increased falls over the past few weeks.  Found down today, unwitnessed.  One clearly mechanical, others unwitnessed.  Increasing falls seems to be different than normal over the past few weeks based on my discussion with caregivers.   '[ ]'$  f/u pending EKG CK mildly elevated, UA without evidence of rhabdo PT/OT Negative head/neck CT Negative R hip plain films No obvious injury on my exam Has sutures in place from fall on 10/4, these should be removed prior to d/c   Dementia  AMS:  Seems like he's been declining over the past few weeks.  No clear reason for this, but increased falls and hypothermia noted today.  He's disoriented today, mumbling incoherent speech, this seems slightly worse than baseline based on my discussion, though not sure how far off it is (on my discussion with daughter, sounds like he waxes/wanes).  - delirium precautions, sitter - continue home meds, klonipin, depakote, risperdal, paxil - hold hydroxyzine give his AMS - Nurse to do bedside swallow prior to diet  HTN:  metop Anemia: continue iron. F/u iron panel, B12, folate Hx AS, echo 2012 with bioprosthetic AV: continue ASA T2DM: Hold SSI for now with only mildy elevated BG, A1c CKD: stage IV, Cr 2.28, increasing slightly over past few months.  Baseline around 1.9-2.1 recently.  avoid nephrotoxins  DVT prophylaxis: heparin  Code Status: DNR  Family Communication: discussed with daughter over phone Disposition Plan: possibly tomorrow if improved temperature  Consults called: none  Admission status: telemetry (initially placed for stepdown with bairhugger, but improved)   Fayrene Helper MD [facility] Pager [phone]  If 7PM-7AM, please contact night-coverage [email or web address] Password [facility]  08/27/2017, 4:38 PM

## 2017-08-27 NOTE — Progress Notes (Signed)
Is NPO until BS swallow can be done.  Pt remains confused/agitated/unable to follow commands so ER nurse, Arlys John, states he cannot perform test.  Pt meds are PO.  I have contacted Dr. Antionette Char who is on call to see if he wants to order any  meds  Another routs pt is currently ordered Depakote, klonopin, resperidol, trazadone for HS.  Pt temp has improved it is now rectally 98.7, bp is 99/55, resp 18, sats on RA 100%.  Pt very non compliant continues to hit, bite, pinch etc.  Bed alarm remains on.  Has not attempted to get oob without assistance.  Dr. Ivar Bury has left a PRN order for sitter.  Will continue to monitor

## 2017-08-27 NOTE — ED Provider Notes (Addendum)
[facility]-EMERGENCY DEPT Provider Note   CSN: [id number] Arrival date & time: 08/27/17  0825     History   Chief Complaint Chief Complaint  Patient presents with  . Fall    HPI [name] is a 83 y.o. male.  HPI Level 5 caveat due to dementia. History of dementia, AS, HTN, HLD, and DM. From [facility]. Patient was found on the floor by staff this morning with unwitnessed fall. He is unable to provide any history. Per facility, his mental status is baseline. Denies any complaints of pain.   Past Medical History:  Diagnosis Date  . Anemia   . Aortic stenosis   . Arthritis   . Dementia   . HOH (hard of hearing)   . Hyperlipidemia   . Hypertension   . Renal insufficiency   . Type 2 diabetes mellitus (HCC)    borderline    Patient Active Problem List   Diagnosis Date Noted  . Dementia with behavioral disturbance 07/28/2017  . Age-related cognitive decline 09/25/2016  . AORTIC VALVE REPLACEMENT, HX OF 01/15/2010  . ATRIAL FIBRILLATION 10/07/2009  . RENAL INSUFFICIENCY 08/14/2009  . CONGENITAL FACTOR VIII DISORDER 08/07/2009  . ACUTE RHEUMATIC ENDOCARDITIS 08/07/2009  . CORONARY ATHEROSCLEROSIS NATIVE CORONARY ARTERY 08/07/2009  . DM 06/10/2009  . HYPERCHOLESTEROLEMIA, PURE 11/20/2008  . HYPERTENSION, BENIGN 11/20/2008  . AORTIC STENOSIS/ INSUFFICIENCY, NON-RHEUMATIC 11/20/2008    Past Surgical History:  Procedure Laterality Date  . AORTIC VALVE REPLACEMENT  November 2010  . BACK SURGERY    . KNEE SURGERY     Left Knee  . OLECRANON BURSECTOMY  04/04/2012   Procedure: OLECRANON BURSA;  Surgeon: Tami Ribas, MD;  Location: [facility];  Service: Orthopedics;  Laterality: Left;  left elbow bursa I&D  . TONSILLECTOMY         Home Medications    Prior to Admission medications   Medication Sig Start Date End Date Taking? Authorizing Provider  aspirin 81 MG tablet Take 81 mg by mouth daily.    Yes [provider]  clonazePAM  (KLONOPIN) 0.5 MG tablet Take 0.5 mg by mouth 2 (two) times daily.   Yes [provider]  clonazePAM (KLONOPIN) 1 MG tablet Take 1 mg by mouth at bedtime.   Yes [provider]  divalproex (DEPAKOTE SPRINKLE) 125 MG capsule Take 250 mg by mouth 2 (two) times daily.  08/16/17  Yes [provider]  docusate sodium (COLACE) 100 MG capsule Take 100 mg by mouth daily.   Yes [provider]  ferrous sulfate 325 (65 FE) MG EC tablet Take 325 mg by mouth 2 (two) times daily.   Yes [provider]  hydrOXYzine (ATARAX/VISTARIL) 10 MG tablet Take 1 tablet (10 mg total) by mouth 3 (three) times daily. 07/28/17  Yes Charm Rings, NP  metoprolol succinate (TOPROL-XL) 25 MG 24 hr tablet Take 12.5 mg by mouth daily. 07/19/17  Yes [provider]  PARoxetine (PAXIL) 10 MG tablet Take 10 mg by mouth at bedtime.   Yes [provider]  risperiDONE (RISPERDAL) 1 MG/ML oral solution Take 0.3 mLs (0.3 mg total) by mouth 2 (two) times daily. 07/28/17  Yes Charm Rings, NP  Skin Protectants, Misc. (EUCERIN) cream Apply 1 application topically 2 (two) times daily.   Yes [provider]  torsemide (DEMADEX) 20 MG tablet Take 40 mg by mouth daily.    Yes [provider]  traZODone (DESYREL) 100 MG tablet Take 100 mg  by mouth at bedtime. 08/16/17  Yes [provider]  metoprolol tartrate (LOPRESSOR) 25 MG tablet TAKE 1 TABLET BY MOUTH EVERY DAY Patient not taking: Reported on 07/26/2017 02/12/12   Lewayne Bunting, MD    Family History No family history on file.  Social History Social History  Substance Use Topics  . Smoking status: Never Smoker  . Smokeless tobacco: Never Used  . Alcohol use No     Allergies   Patient has no known allergies.   Review of Systems Review of Systems  Unable to perform ROS: Dementia     Physical Exam Updated Vital Signs BP (!) 125/59   Pulse 64   Resp 16   SpO2 100%   Physical  Exam Physical Exam  Nursing note and vitals reviewed. Constitutional:  non-toxic, and in no acute distress Head: Normocephalic and atraumatic.  Mouth/Throat: Oropharynx is clear and dry.  Neck: Normal range of motion. Neck supple. No cervical spine tenderness. Cardiovascular: Normal rate and regular rhythm.   Pulmonary/Chest: Effort normal and breath sounds normal. No chest wall tenderness. Abdominal: Soft. There is no tenderness. There is no rebound and no guarding.  Musculoskeletal: Normal range of motion of all 4 extremities. There is bruising to the right hip, but ROM is present and full. Old skin tear to the left elbow without surrounding skin changes or drainage. ROM of elbow full. No deformities..  Neurological: Alert, slow but fluent speech, moves all extremities symmetrically and to command.  Skin: Skin is warm and dry.  Psychiatric: Cooperative   ED Treatments / Results  Labs (all labs ordered are listed, but only abnormal results are displayed) Labs Reviewed - No data to display  EKG  EKG Interpretation None       Radiology Ct Head Wo Contrast  Result Date: 08/27/2017 CLINICAL DATA:  Head trauma, found down, unwitnessed fall EXAM: CT HEAD WITHOUT CONTRAST CT CERVICAL SPINE WITHOUT CONTRAST TECHNIQUE: Multidetector CT imaging of the head and cervical spine was performed following the standard protocol without intravenous contrast. Multiplanar CT image reconstructions of the cervical spine were also generated. COMPARISON:  08/18/2017 FINDINGS: CT HEAD FINDINGS Brain: No evidence of acute infarction, hemorrhage, hydrocephalus, extra-axial collection or mass lesion/mass effect. Global cortical atrophy. Extensive subcortical white matter and periventricular small vessel ischemic changes. Vascular: Intracranial atherosclerosis Skull: Normal. Negative for fracture or focal lesion. Sinuses/Orbits: The visualized paranasal sinuses are essentially clear. The mastoid air cells are  unopacified. Other: None. CT CERVICAL SPINE FINDINGS Alignment: Normal cervical lordosis. Skull base and vertebrae: No acute fracture. No primary bone lesion or focal pathologic process. Soft tissues and spinal canal: No prevertebral fluid or swelling. No visible canal hematoma. Disc levels:  Mild multilevel degenerative changes. Upper chest: Visualized lung apices are clear. Other: Visualized thyroid is unremarkable. IMPRESSION: No evidence of acute intracranial abnormality. Atrophy with extensive small vessel ischemic changes. No evidence of traumatic injury to the cervical spine. Mild multilevel degenerative changes. Electronically Signed   By: Charline Bills M.D.   On: 08/27/2017 09:37   Ct Cervical Spine Wo Contrast  Result Date: 08/27/2017 CLINICAL DATA:  Head trauma, found down, unwitnessed fall EXAM: CT HEAD WITHOUT CONTRAST CT CERVICAL SPINE WITHOUT CONTRAST TECHNIQUE: Multidetector CT imaging of the head and cervical spine was performed following the standard protocol without intravenous contrast. Multiplanar CT image reconstructions of the cervical spine were also generated. COMPARISON:  08/18/2017 FINDINGS: CT HEAD FINDINGS Brain: No evidence of acute infarction, hemorrhage, hydrocephalus, extra-axial collection or mass lesion/mass  effect. Global cortical atrophy. Extensive subcortical white matter and periventricular small vessel ischemic changes. Vascular: Intracranial atherosclerosis Skull: Normal. Negative for fracture or focal lesion. Sinuses/Orbits: The visualized paranasal sinuses are essentially clear. The mastoid air cells are unopacified. Other: None. CT CERVICAL SPINE FINDINGS Alignment: Normal cervical lordosis. Skull base and vertebrae: No acute fracture. No primary bone lesion or focal pathologic process. Soft tissues and spinal canal: No prevertebral fluid or swelling. No visible canal hematoma. Disc levels:  Mild multilevel degenerative changes. Upper chest: Visualized lung apices  are clear. Other: Visualized thyroid is unremarkable. IMPRESSION: No evidence of acute intracranial abnormality. Atrophy with extensive small vessel ischemic changes. No evidence of traumatic injury to the cervical spine. Mild multilevel degenerative changes. Electronically Signed   By: Charline Bills M.D.   On: 08/27/2017 09:37   Dg Hip Unilat W Or Wo Pelvis 2-3 Views Right  Result Date: 08/27/2017 CLINICAL DATA:  Patient found down this morning. Right hip pain. Initial encounter. EXAM: DG HIP (WITH OR WITHOUT PELVIS) 2-3V RIGHT COMPARISON:  None. FINDINGS: No acute bony or joint abnormality is identified. Lower lumbar degenerative change is seen. Atherosclerosis is noted. IMPRESSION: No acute abnormality. Lower lumbar degenerative disease. Atherosclerosis. Electronically Signed   By: Drusilla Kanner M.D.   On: 08/27/2017 09:11    Procedures Procedures (including critical care time) CRITICAL CARE Performed by: Lavera Guise   Total critical care time: 31 minutes  Critical care time was exclusive of separately billable procedures and treating other patients.  Critical care was necessary to treat or prevent imminent or life-threatening deterioration.  Critical care was time spent personally by me on the following activities: development of treatment plan with patient and/or surrogate as well as nursing, discussions with consultants, evaluation of patient's response to treatment, examination of patient, obtaining history from patient or surrogate, ordering and performing treatments and interventions, ordering and review of laboratory studies, ordering and review of radiographic studies, pulse oximetry and re-evaluation of patient's condition.  Medications Ordered in ED Medications - No data to display   Initial Impression / Assessment and Plan / ED Course  I have reviewed the triage vital signs and the nursing notes.  Pertinent labs & imaging results that were available during my care  of the patient were reviewed by me and considered in my medical decision making (see chart for details).     At reported mental status baseline in the ED. Evidence of old bruising and skin tear from previous falls. No focal neurological deficits. Vital signs are normal. No new injuries or pain elicited. CT head and cervical spine without acute traumatic injuries. XR hip due to bruising performed, visualized and without acute fracture.  Although no traumatic injuries, the patient is noted to be hypothermic on rectal temperature. 93.10F. Will place bair hugger. Will require additional work-up including infectious work-up.   Chest x-ray shows no pneumonia or other cardiopulmonary processes. Urine without evidence of UTI. No major ultralight or metabolic derangements on his blood work. Normal thyroid testing. Urology of his hypothermia at this point. He has been placed on a bear hugger, with improved central temperature. Discussed with Dr. Lowell Guitar from hospitalist service who will admit for ongoing management.  Final Clinical Impressions(s) / ED Diagnoses   Final diagnoses:  Unwitnessed fall    New Prescriptions New Prescriptions   No medications on file     Lavera Guise, MD 08/27/17 1610    Lavera Guise, MD 08/27/17 1251

## 2017-08-27 NOTE — ED Triage Notes (Signed)
He comes to [place] from [facility] with c/o that their day staff found pt. On his floor. He has no complaints. He is confused and at his baseline.

## 2017-08-27 NOTE — ED Notes (Addendum)
Advised hospitalist that pt needs a [facility] ICU bed d/t 4th floor refusing since he is on the bair hugger. Dr Lowell Guitar sts that he will modify admit order to [facility]. Primary RN made aware

## 2017-08-27 NOTE — ED Notes (Signed)
Call report to April, RN at [phone] when ready to give report.

## 2017-08-28 DIAGNOSIS — R296 Repeated falls: Secondary | ICD-10-CM

## 2017-08-28 DIAGNOSIS — T68XXXA Hypothermia, initial encounter: Secondary | ICD-10-CM

## 2017-08-28 DIAGNOSIS — W19XXXA Unspecified fall, initial encounter: Secondary | ICD-10-CM

## 2017-08-28 DIAGNOSIS — D649 Anemia, unspecified: Secondary | ICD-10-CM

## 2017-08-28 DIAGNOSIS — N184 Chronic kidney disease, stage 4 (severe): Secondary | ICD-10-CM

## 2017-08-28 DIAGNOSIS — T68XXXD Hypothermia, subsequent encounter: Secondary | ICD-10-CM

## 2017-08-28 DIAGNOSIS — Z7189 Other specified counseling: Secondary | ICD-10-CM

## 2017-08-28 DIAGNOSIS — F0391 Unspecified dementia with behavioral disturbance: Secondary | ICD-10-CM

## 2017-08-28 LAB — BASIC METABOLIC PANEL
Anion gap: 10 (ref 5–15)
BUN: 36 mg/dL — AB (ref 6–20)
CHLORIDE: 115 mmol/L — AB (ref 101–111)
CO2: 25 mmol/L (ref 22–32)
CREATININE: 2.08 mg/dL — AB (ref 0.61–1.24)
Calcium: 9.5 mg/dL (ref 8.9–10.3)
GFR calc Af Amer: 30 mL/min — ABNORMAL LOW (ref >60)
GFR calc non Af Amer: 26 mL/min — ABNORMAL LOW (ref >60)
GLUCOSE: 102 mg/dL — AB (ref 65–99)
POTASSIUM: 4.2 mmol/L (ref 3.5–5.1)
Sodium: 150 mmol/L — ABNORMAL HIGH (ref 135–145)

## 2017-08-28 LAB — HEMOGLOBIN A1C
Hgb A1c MFr Bld: 5.6 % (ref 4.8–5.6)
Mean Plasma Glucose: 114.02 mg/dL

## 2017-08-28 LAB — CBC
HEMATOCRIT: 30.4 % — AB (ref 39.0–52.0)
Hemoglobin: 9.9 g/dL — ABNORMAL LOW (ref 13.0–17.0)
MCH: 30.9 pg (ref 26.0–34.0)
MCHC: 32.6 g/dL (ref 30.0–36.0)
MCV: 95 fL (ref 78.0–100.0)
PLATELETS: 177 10*3/uL (ref 150–400)
RBC: 3.2 MIL/uL — ABNORMAL LOW (ref 4.22–5.81)
RDW: 14.2 % (ref 11.5–15.5)
WBC: 7.8 10*3/uL (ref 4.0–10.5)

## 2017-08-28 LAB — MRSA PCR SCREENING: MRSA by PCR: POSITIVE — AB

## 2017-08-28 MED ORDER — CHLORHEXIDINE GLUCONATE CLOTH 2 % EX PADS
6.0000 | MEDICATED_PAD | Freq: Every day | CUTANEOUS | Status: DC
  Administered 2017-08-28 – 2017-08-29 (×2): 6 via TOPICAL

## 2017-08-28 MED ORDER — DEXTROSE 5 % IV SOLN
INTRAVENOUS | Status: DC
  Administered 2017-08-28 – 2017-08-29 (×3): via INTRAVENOUS

## 2017-08-28 MED ORDER — MUPIROCIN 2 % EX OINT
1.0000 "application " | TOPICAL_OINTMENT | Freq: Two times a day (BID) | CUTANEOUS | Status: DC
  Administered 2017-08-28 – 2017-08-29 (×3): 1 via NASAL
  Filled 2017-08-28: qty 22

## 2017-08-28 NOTE — Progress Notes (Signed)
PT Cancellation Note  Patient Details Name: [name] MRN: [id number] DOB: 03/27/1924   Cancelled Treatment:     Noted plans for patient for purely comfort care. At this time , no needs seen for acute skilled PT. If this changes, please don't hesitate to re contact us. [phone] Thank you.    Marella Bile 08/28/2017, 6:19 PM  Marella Bile, PT Pager: [phone] 08/28/2017

## 2017-08-28 NOTE — Progress Notes (Signed)
Pt has started to attempt to get oob without assistance and pulling on lines.  Sitter is now at bedside.  MRSA test came back +.  Education provided to pt and contact precautions started. Pt has infiltrated pt is a difficult stick have notified IVT to come and restart IV site.

## 2017-08-28 NOTE — Progress Notes (Signed)
CSW received consult that pt will need hospice to follow pt at ALF.  CSW called ALF and was informed they use [facility]- requested we start referral process from the hospital- RNCM informed  CSW will continue to follow and assist with return to ALF when stable  Burna Sis MSW, LCSW [facility] #: [phone]

## 2017-08-28 NOTE — Consult Note (Signed)
Consultation Note Date: 08/28/2017   Patient Name: [name]  DOB: [birth year]  MRN: [id number]  Age / Sex: 83 y.o., male  PCP: Creola Corn, MD Referring Physician: Kathlen Mody, MD  Reason for Consultation: Establishing goals of care and Psychosocial/spiritual support  HPI/Patient Profile: 83 y.o. male  with past medical history of dementia, DM2, CKD, HTN, HLD, and anemia. He has been experiencing recurrent falls and fluctuating mentation with periods of significant behavioral disturbances. He now presents after being found down after an unwitnessed fall. He was noted to be agitated, hypothermic, and hypernatremic. He was admitted on 08/27/2017. His mental status waxes and wanes, however he has been more agitated recently with concern for an acute metabolic encephalopathy. This is may be from the dehydration and hypernatremia. Hypothermia improved shortly after admission. Treatment at this point has been limited by the patients refusal for IV placement, labs, and examination. Given his overall decline over the past few weeks, Palliative Care was consulted to assist in clarifying goals of care.   Clinical Assessment and Goals of Care: [name] was socially conversant but only oriented to self. He was not physically aggressive while I was in the room, however his sitter reported more aggressive behavior earlier in the day. He is not able to meaningfully engage in a goals of care conversation due to his dementia. His HCPOA is his daughter, [name]. She and her sister ([name]) cooperatively make decisions.  I spoke with [name] and [name] separately on the phone (they could not both be present at the hospital today). I reviewed his medical history, changes to his health that were well documented in the chart, and his current presentation. They both verbalized a clear and progressive decline in his health over the past few months, with more rapid decline over  the past few weeks. He has been having more frequent behavioral issues, including aggression and refusal of care interventions such as medication. He has also had more frequent falls and an observably deteriorating gait. While he has had numerous hospitalizations for these issues--mainly at [facility]--neither of them feel these hospitalization have improved his quality of life. They both express the goal of care should be quality of life, and want to limit things that cause him distress or discomfort.  In considering their goal of care and the recognition that hospitalizations have not been helping, I brought up the option of transitioning from an aggressive care approach towards a comfort one. I explained the role Hospice could play in helping achieve comfort, and the differences in this approach compared to the current one. They were both in strong support of the comfort option. I explained he would have to be eligible for Hospice, as well as [place] accepting of it. They understood and asked that I proceed with the Hospice referral. In the interim, they want a comfort focused approach to [name]'s care. No labs, IVs only as he allows, and no restraints (unless for safety).   Of note, I believe [name] has <1mo left in the setting of end stage dementia with increased falls, decreased intake with dehydration and electrolyte abnormalities (refusing all food and fluids at present), and increased aggression/encephalopathy that is concerning for a terminal delirium. His progressive decline detailed in the chart and by his family also highlight his trajectory. The family's goals are purely comfort and want to support him in being comfortable in place with avoidance of future hospitalizations.   Primary Decision Maker Pt's two daughters cooperatively make decisions. [name] has  HCPOA.   SUMMARY OF RECOMMENDATIONS    DNR confirmed. Shift to a comfort focused care plan. The goal is to limit interventions  with the aim of reducing pt discomfort and distress. No further labs. IV use as pt tolerates. No restraining unless in situations to ensure safety.   SW consulted for  Hospice referral at [facility]   Code Status/Advance Care Planning:  DNR  Additional Recommendations (Limitations, Scope, Preferences):  Avoid Hospitalization, No Lab Draws and No Surgical Procedures  Psycho-social/Spiritual:   Desire for further Chaplaincy support:no  Additional Recommendations: Education on Hospice  Prognosis:   < 6 months  Discharge Planning: Skilled Nursing Facility with Hospice      Primary Diagnoses: Present on Admission: . Hypothermia   I have reviewed the medical record, interviewed the patient and family, and examined the patient. The following aspects are pertinent.  Past Medical History:  Diagnosis Date  . Anemia   . Aortic stenosis   . Arthritis   . Dementia   . HOH (hard of hearing)   . Hyperlipidemia   . Hypertension   . Renal insufficiency   . Type 2 diabetes mellitus (HCC)    borderline   Social History   Social History  . Marital status: Widowed    Spouse name: N/A  . Number of children: N/A  . Years of education: N/A   Occupational History  . Retired Company secretary    Social History Main Topics  . Smoking status: Never Smoker  . Smokeless tobacco: Never Used  . Alcohol use No  . Drug use: No  . Sexual activity: Not Asked   Other Topics Concern  . None   Social History Narrative  . None   No family history on file. Scheduled Meds: . aspirin EC  81 mg Oral Daily  . Chlorhexidine Gluconate Cloth  6 each Topical Q0600  . clonazePAM  0.5 mg Oral BID WC  . clonazePAM  1 mg Oral QHS  . docusate sodium  100 mg Oral Daily  . ferrous sulfate  325 mg Oral BID WC  . heparin  5,000 Units Subcutaneous Q8H  . metoprolol succinate  12.5 mg Oral Daily  . mupirocin ointment  1 application Nasal BID  . PARoxetine  10 mg Oral QHS  . risperiDONE  0.25 mg Oral  BID  . torsemide  40 mg Oral Daily  . traZODone  100 mg Oral QHS   Continuous Infusions: . dextrose 100 mL/hr at 08/28/17 1030  . valproate sodium Stopped (08/28/17 1130)   PRN Meds:.LORazepam No Known Allergies   Review of Systems  Unable to perform ROS   Physical Exam  Constitutional: No distress.  HENT:  Head: Normocephalic and atraumatic.  Mouth/Throat: Oropharynx is clear and moist. Abnormal dentition. No oropharyngeal exudate.  Eyes: EOM are normal.  Neck: Normal range of motion. Neck supple.  Cardiovascular:  UTA, pt too restless to adequately assess. Pt agitated when attempted.  Abdominal: Soft. Bowel sounds are normal. He exhibits no distension.  Musculoskeletal: Normal range of motion.  Neurological: He is alert.  Oriented to self only. Unable to follow simple commands.  Skin: Skin is warm and dry. There is pallor.  Multiple areas of skin tears and abrasions. Sutures noted over left eyebrow  Psychiatric: His speech is delayed, tangential and slurred. He is agitated, aggressive, hyperactive and combative. Cognition and memory are impaired. He expresses impulsivity and inappropriate judgment.  Socially engaging, however physically very restless. Will become physically aggressive intermittently.  Vital Signs: BP (!) 138/54 (BP Location: Right Leg)   Pulse 86   Temp 97.8 F (36.6 C) (Rectal)   Resp 18   Ht  (1.778 m) Comment: estimate  Wt 80.8 kg (178 lb 2.1 oz)   SpO2 100%   BMI 25.56 kg/m  Pain Assessment: No/denies pain     SpO2: SpO2: 100 % O2 Device:SpO2: 100 % O2 Flow Rate: .   IO: Intake/output summary:  Intake/Output Summary (Last 24 hours) at 08/28/17 1159 Last data filed at 08/28/17 0558  Gross per 24 hour  Intake             1545 ml  Output                0 ml  Net             1545 ml   LBM:   Baseline Weight: Weight: 80.8 kg (178 lb 2.1 oz) Most recent weight: Weight: 80.8 kg (178 lb 2.1 oz)       Time Total: 75 minutes    Greater than 50%  of this time was spent counseling and coordinating care related to the above assessment and plan.  Signed by: Murrell Converse, NP Palliative Medicine Team Pager # [phone] (M-F 7a-5p) Team Phone # [phone] (Nights/Weekends)

## 2017-08-28 NOTE — Progress Notes (Signed)
PROGRESS NOTE    [name]  UJW:[id number] DOB: 07-Apr-1924 DOA: 08/27/2017 PCP: Creola Corn, MD    Brief Narrative: [name] is a 83 y.o. male with medical history significant of dementia, type 2 diabetes, chronic kidney disease, hypertension, hyperlipidemia, anemia who presented to the ED after being found down with an unwitnessed fall, was found to be hypothermic and acute metabolic encephalopathy.   Assessment & Plan:   Principal Problem:   Hypothermia Active Problems:   Type 2 diabetes mellitus (HCC)   History of aortic valve replacement with bioprosthetic valve   Falls   Anemia   CKD (chronic kidney disease), stage IV (HCC)   Acute metabolic encephalopathy:  Possibly from hypernatremia and dehydration/ dementia.  Free water and dextrose fluids.  Repeat labs in am.  Unfortunately he is refusing the IV placement, lab draws and to be examined.    Type 2 DM CBG (last 3)  No results for input(s): GLUCAP in the last 72 hours.  Will start him CBGS every 4 hours.  A1c is 5.6    Stage 4 CKD:  APPEARS at baseline.    Deconditioning:  Would explain his falls.   Anemia of chronic disease:  Hemoglobin is around 9.    Hypothermia:  Resolved.    Hypertension:  Well controlled.      DVT prophylaxis: heparin.  Code Status: DNR Family Communication: discussed with daughter.  Disposition Plan: pending palliative care consult.   Consultants:  Palliative care  Procedures:  None  Antimicrobials: none.   Subjective: Mumbling. Saying he doesn't need a doctor.   Objective: Vitals:   08/27/17 2145 08/28/17 0500 08/28/17 0615 08/28/17 0655  BP: (!) 99/55  (!) 147/69 (!) 138/54  Pulse: 88  86   Resp: 20  18   Temp: 98.7 F (37.1 C) 97.8 F (36.6 C)    TempSrc: Rectal Rectal    SpO2: 100%  100%   Weight:      Height:        Intake/Output Summary (Last 24 hours) at 08/28/17 1131 Last data filed at 08/28/17 0558  Gross per 24 hour    Intake             1545 ml  Output                0 ml  Net             1545 ml   Filed Weights   08/27/17 1921  Weight: 80.8 kg (178 lb 2.1 oz)    Examination:  Couldn't be done , he was combative , and trying to punch and kick with the legs.   Data Reviewed: I have personally reviewed following labs and imaging studies  CBC:  Recent Labs Lab 08/27/17 1030 08/28/17 0508  WBC 8.3 7.8  NEUTROABS 5.6  --   HGB 9.9* 9.9*  HCT 29.9* 30.4*  MCV 94.3 95.0  PLT 163 177   Basic Metabolic Panel:  Recent Labs Lab 08/27/17 1030 08/28/17 0508  NA 146* 150*  K 4.1 4.2  CL 109 115*  CO2 26 25  GLUCOSE 110* 102*  BUN 42* 36*  CREATININE 2.28* 2.08*  CALCIUM 9.8 9.5   GFR: Estimated Creatinine Clearance: 23.9 mL/min (A) (by C-G formula based on SCr of 2.08 mg/dL (H)). Liver Function Tests:  Recent Labs Lab 08/27/17 1030  AST 32  ALT 23  ALKPHOS 88  BILITOT 0.5  PROT 6.8  ALBUMIN 3.8   No  results for input(s): LIPASE, AMYLASE in the last 168 hours.  Recent Labs Lab 08/27/17 1824  AMMONIA 12   Coagulation Profile: No results for input(s): INR, PROTIME in the last 168 hours. Cardiac Enzymes:  Recent Labs Lab 08/27/17 1030  CKTOTAL 586*   BNP (last 3 results) No results for input(s): PROBNP in the last 8760 hours. HbA1C:  Recent Labs  08/28/17 0508  HGBA1C 5.6   CBG: No results for input(s): GLUCAP in the last 168 hours. Lipid Profile: No results for input(s): CHOL, HDL, LDLCALC, TRIG, CHOLHDL, LDLDIRECT in the last 72 hours. Thyroid Function Tests:  Recent Labs  08/27/17 1030  TSH 1.954   Anemia Panel:  Recent Labs  08/27/17 1824  VITAMINB12 848  FOLATE 11.4  FERRITIN 234  TIBC 253  IRON 52   Sepsis Labs:  Recent Labs Lab 08/27/17 1824 08/27/17 1933  LATICACIDVEN 1.0 1.0    Recent Results (from the past 240 hour(s))  MRSA PCR Screening     Status: Abnormal   Collection Time: 08/27/17 11:13 PM  Result Value Ref Range  Status   MRSA by PCR POSITIVE (A) NEGATIVE Final    Comment:        The GeneXpert MRSA Assay (FDA approved for NASAL specimens only), is one component of a comprehensive MRSA colonization surveillance program. It is not intended to diagnose MRSA infection nor to guide or monitor treatment for MRSA infections. RESULT CALLED TO, READ BACK BY AND VERIFIED WITH: K LOCKWOOD,RN  08/28/17 [facility]          Radiology Studies: Ct Head Wo Contrast  Result Date: 08/27/2017 CLINICAL DATA:  Head trauma, found down, unwitnessed fall EXAM: CT HEAD WITHOUT CONTRAST CT CERVICAL SPINE WITHOUT CONTRAST TECHNIQUE: Multidetector CT imaging of the head and cervical spine was performed following the standard protocol without intravenous contrast. Multiplanar CT image reconstructions of the cervical spine were also generated. COMPARISON:  08/18/2017 FINDINGS: CT HEAD FINDINGS Brain: No evidence of acute infarction, hemorrhage, hydrocephalus, extra-axial collection or mass lesion/mass effect. Global cortical atrophy. Extensive subcortical white matter and periventricular small vessel ischemic changes. Vascular: Intracranial atherosclerosis Skull: Normal. Negative for fracture or focal lesion. Sinuses/Orbits: The visualized paranasal sinuses are essentially clear. The mastoid air cells are unopacified. Other: None. CT CERVICAL SPINE FINDINGS Alignment: Normal cervical lordosis. Skull base and vertebrae: No acute fracture. No primary bone lesion or focal pathologic process. Soft tissues and spinal canal: No prevertebral fluid or swelling. No visible canal hematoma. Disc levels:  Mild multilevel degenerative changes. Upper chest: Visualized lung apices are clear. Other: Visualized thyroid is unremarkable. IMPRESSION: No evidence of acute intracranial abnormality. Atrophy with extensive small vessel ischemic changes. No evidence of traumatic injury to the cervical spine. Mild multilevel degenerative changes.  Electronically Signed   By: Charline Bills M.D.   On: 08/27/2017 09:37   Ct Cervical Spine Wo Contrast  Result Date: 08/27/2017 CLINICAL DATA:  Head trauma, found down, unwitnessed fall EXAM: CT HEAD WITHOUT CONTRAST CT CERVICAL SPINE WITHOUT CONTRAST TECHNIQUE: Multidetector CT imaging of the head and cervical spine was performed following the standard protocol without intravenous contrast. Multiplanar CT image reconstructions of the cervical spine were also generated. COMPARISON:  08/18/2017 FINDINGS: CT HEAD FINDINGS Brain: No evidence of acute infarction, hemorrhage, hydrocephalus, extra-axial collection or mass lesion/mass effect. Global cortical atrophy. Extensive subcortical white matter and periventricular small vessel ischemic changes. Vascular: Intracranial atherosclerosis Skull: Normal. Negative for fracture or focal lesion. Sinuses/Orbits: The visualized paranasal sinuses are essentially clear. The  mastoid air cells are unopacified. Other: None. CT CERVICAL SPINE FINDINGS Alignment: Normal cervical lordosis. Skull base and vertebrae: No acute fracture. No primary bone lesion or focal pathologic process. Soft tissues and spinal canal: No prevertebral fluid or swelling. No visible canal hematoma. Disc levels:  Mild multilevel degenerative changes. Upper chest: Visualized lung apices are clear. Other: Visualized thyroid is unremarkable. IMPRESSION: No evidence of acute intracranial abnormality. Atrophy with extensive small vessel ischemic changes. No evidence of traumatic injury to the cervical spine. Mild multilevel degenerative changes. Electronically Signed   By: Charline Bills M.D.   On: 08/27/2017 09:37   Dg Chest Port 1 View  Result Date: 08/27/2017 CLINICAL DATA:  Shortness of breath. Hypothermia. Evaluate for pneumonia. EXAM: PORTABLE CHEST 1 VIEW COMPARISON:  07/27/2017; 10/13/2009 FINDINGS: Grossly unchanged cardiac silhouette and mediastinal contours with atherosclerotic plaque  within the thoracic aorta. There is persistent thickening of the right paratracheal stripe, presumably secondary to prominent vasculature. Post median sternotomy. No focal airspace opacities. No pleural effusion or pneumothorax. No evidence of edema. No acute osseus abnormalities. IMPRESSION: 1.  No acute cardiopulmonary disease. 2.  Aortic Atherosclerosis (ICD10-I70.0). Electronically Signed   By: Simonne Come M.D.   On: 08/27/2017 11:04   Dg Hip Unilat W Or Wo Pelvis 2-3 Views Right  Result Date: 08/27/2017 CLINICAL DATA:  Patient found down this morning. Right hip pain. Initial encounter. EXAM: DG HIP (WITH OR WITHOUT PELVIS) 2-3V RIGHT COMPARISON:  None. FINDINGS: No acute bony or joint abnormality is identified. Lower lumbar degenerative change is seen. Atherosclerosis is noted. IMPRESSION: No acute abnormality. Lower lumbar degenerative disease. Atherosclerosis. Electronically Signed   By: Drusilla Kanner M.D.   On: 08/27/2017 09:11        Scheduled Meds: . aspirin EC  81 mg Oral Daily  . Chlorhexidine Gluconate Cloth  6 each Topical Q0600  . clonazePAM  0.5 mg Oral BID WC  . clonazePAM  1 mg Oral QHS  . docusate sodium  100 mg Oral Daily  . ferrous sulfate  325 mg Oral BID WC  . heparin  5,000 Units Subcutaneous Q8H  . metoprolol succinate  12.5 mg Oral Daily  . mupirocin ointment  1 application Nasal BID  . PARoxetine  10 mg Oral QHS  . risperiDONE  0.25 mg Oral BID  . torsemide  40 mg Oral Daily  . traZODone  100 mg Oral QHS   Continuous Infusions: . dextrose 100 mL/hr at 08/28/17 1030  . valproate sodium 250 mg (08/28/17 1030)     LOS: 1 day    Time spent: 35 minutes.     Kathlen Mody, MD [facility] Pager [phone]  If 7PM-7AM, please contact night-coverage [email or web address] Password [email or web address] 08/28/2017, 11:31 AM

## 2017-08-28 NOTE — Progress Notes (Signed)
[facility]) Hospital Liaison:  RN visit  Notified by Isidoro Donning, CMRN, of patient/family request for [facility] services at [facility] after discharge. Chart and patient information under review by [facility] physician. Hospice eligibility pending at this time.  Writer spoke with [employer] by telephone at to initiate education related to hospice philosophy, services and team approach to care.  [name]  verbalized understanding of information given.  Per discussion, plan is for discharge to [facility] by [facility] on 08/29/17.  Please send signed and completed DNR form home with patient/family.  Patient will need prescriptions for discharge comfort medications.  DME needs have been discussed, patient currently has the following equipment in the home: current equipment provided by facility.  There are no DME requests at this time.    [facility] aware of the above.    Please notify [facility] when patient is ready to leave the unit at discharge. (Call [phone] or [phone] after 5pm.)  [facility] information and contact numbers given to [facility] during this visit.  Above information shared with Isidoro Donning, CMRN.  Please call with any hospice related questions.  Thank you for this referral.  Elsie Saas [facility] Liaison [phone] liaisons are now on AMION.

## 2017-08-28 NOTE — Care Management Note (Signed)
Case Management Note  Patient Details  Name: [name] MRN: [id number] Date of Birth: 11/30/1923  Subjective/Objective:     Hypothermia, DM, falls, Anemia, Acute metabolic encephalopathy               Action/Plan: Discharge Planning: NCM spoke to dtr, [employer] # [phone]. Offered choice for Hospice. Dtr states they used [facility] in the past. [facility] [place] rep with new referral. She was stating that referral has to come from facility. States she will contact NCM back with confirmation. Message sent to attending for instructions to ALF to arrange Hospice. CSW referral for dc back to ALF.   PCP Creola Corn MD    Expected Discharge Date:                 Expected Discharge Plan:  Assisted Living / Rest Home  In-House Referral:  Clinical Social Work  Discharge planning Services  CM Consult  Post Acute Care Choice:  Hospice Choice offered to:  Adult Children  DME Arranged:  N/A DME Agency:  NA  HH Arranged:  RN HH Agency:  [facility] [place]  Status of Service:  Completed, signed off  If discussed at Long Length of Stay Meetings, dates discussed:    Additional Comments:  Elliot Cousin, RN 08/28/2017, 1:53 PM

## 2017-08-28 NOTE — Progress Notes (Signed)
OT Cancellation Note  Patient Details Name: [name] MRN: [id number] DOB: [birth year]   Cancelled Treatment:    Reason Eval/Treat Not Completed: Other (comment)  Per nurse pt is biting, kicking and spitting.  Also saw palliative consult ordered. Will check on pt next day for appropriateness of OT eval  Sharlie Shreffler, Dorena Bodo, [place] [phone] 08/28/2017, 12:39 PM

## 2017-08-29 ENCOUNTER — Encounter (HOSPITAL_COMMUNITY): Payer: Self-pay | Admitting: *Deleted

## 2017-08-29 NOTE — Care Management Note (Signed)
Case Management Note  Patient Details  Name: [name] MRN: [id number] Date of Birth: [birth year]  Subjective/Objective: d/c back to ALF w/[facility]. No CM needs.                   Action/Plan:d/c ALF w/[facility]   Expected Discharge Date:  08/29/17               Expected Discharge Plan:  Assisted Living / Rest Home  In-House Referral:  Clinical Social Work  Discharge planning Services  CM Consult  Post Acute Care Choice:  Hospice Choice offered to:  Adult Children  DME Arranged:  N/A DME Agency:  NA  HH Arranged:  RN HH Agency:  [facility]  Status of Service:  Completed, signed off  If discussed at Long Length of Stay Meetings, dates discussed:    Additional Comments:  Lanier Clam, RN 08/29/2017, 2:30 PM

## 2017-08-29 NOTE — NC FL2 (Signed)
[place] MEDICAID FL2 LEVEL OF CARE SCREENING TOOL     IDENTIFICATION  Patient Name: [name] Birthdate: [birth year] Sex: male Admission Date (Current Location): 08/27/2017  [facility] and [place] Number:  Producer, television/film/video and Address:  [facility],  [address] [place] [address]      Provider Number: [id number]  Attending Physician Name and Address:  Kathlen Mody, MD  Relative Name and Phone Number:       Current Level of Care: Hospital Recommended Level of Care: Assisted Living Facility Prior Approval Number:    Date Approved/Denied:   PASRR Number:    Discharge Plan: Home    Current Diagnoses: Patient Active Problem List   Diagnosis Date Noted  . Goals of care, counseling/discussion   . Hypothermia 08/27/2017  . History of aortic valve replacement with bioprosthetic valve 08/27/2017  . Falls 08/27/2017  . Anemia 08/27/2017  . CKD (chronic kidney disease), stage IV (HCC) 08/27/2017  . Dementia 07/28/2017  . Age-related cognitive decline 09/25/2016  . AORTIC VALVE REPLACEMENT, HX OF 01/15/2010  . ATRIAL FIBRILLATION 10/07/2009  . RENAL INSUFFICIENCY 08/14/2009  . CONGENITAL FACTOR VIII DISORDER 08/07/2009  . ACUTE RHEUMATIC ENDOCARDITIS 08/07/2009  . CORONARY ATHEROSCLEROSIS NATIVE CORONARY ARTERY 08/07/2009  . Type 2 diabetes mellitus (HCC) 06/10/2009  . HYPERCHOLESTEROLEMIA, PURE 11/20/2008  . Essential hypertension 11/20/2008  . AORTIC STENOSIS/ INSUFFICIENCY, NON-RHEUMATIC 11/20/2008    Orientation RESPIRATION BLADDER Height & Weight     Self  Normal Incontinent Weight: 178 lb 2.1 oz (80.8 kg) Height:   (177.8 cm) (estimate)  BEHAVIORAL SYMPTOMS/MOOD NEUROLOGICAL BOWEL NUTRITION STATUS  Other (Comment) (spitting, kicking intermittently)   Incontinent Diet (soft regular)  AMBULATORY STATUS COMMUNICATION OF NEEDS Skin   Extensive Assist Verbally Other (Comment) (closed incision left face; abrasion left elbow, foam  dressing PRN)                       Personal Care Assistance Level of Assistance  Bathing, Feeding, Dressing Bathing Assistance: Maximum assistance Feeding assistance: Limited assistance Dressing Assistance: Maximum assistance     Functional Limitations Info  Sight, Hearing, Speech Sight Info: Adequate Hearing Info: Impaired Speech Info: Adequate    SPECIAL CARE FACTORS FREQUENCY                       Contractures Contractures Info: Not present    Additional Factors Info  Isolation Precautions, Code Status, Allergies Code Status Info: DNR Allergies Info: NKA     Isolation Precautions Info: Contact Precaution Infection: MRSA        Discharge Medications:  Medication List     TAKE these medications   aspirin 81 MG tablet Take 81 mg by mouth daily.   clonazePAM 1 MG tablet Commonly known as:  KLONOPIN Take 1 mg by mouth at bedtime.   clonazePAM 0.5 MG tablet Commonly known as:  KLONOPIN Take 0.5 mg by mouth 2 (two) times daily.   divalproex 125 MG capsule Commonly known as:  DEPAKOTE SPRINKLE Take 250 mg by mouth 2 (two) times daily.   docusate sodium 100 MG capsule Commonly known as:  COLACE Take 100 mg by mouth daily.   eucerin cream Apply 1 application topically 2 (two) times daily.   ferrous sulfate 325 (65 FE) MG EC tablet Take 325 mg by mouth 2 (two) times daily.   hydrOXYzine 10 MG tablet Commonly known as:  ATARAX/VISTARIL Take 1 tablet (10 mg total) by  mouth 3 (three) times daily.   metoprolol succinate 25 MG 24 hr tablet Commonly known as:  TOPROL-XL Take 12.5 mg by mouth daily.   PARoxetine 10 MG tablet Commonly known as:  PAXIL Take 10 mg by mouth at bedtime.   risperiDONE 1 MG/ML oral solution Commonly known as:  RISPERDAL Take 0.3 mLs (0.3 mg total) by mouth 2 (two) times daily.   torsemide 20 MG tablet Commonly known as:  DEMADEX Take 40 mg by mouth daily.   traZODone 100 MG tablet Commonly known  as:  DESYREL Take 100 mg by mouth at bedtime.    Relevant Imaging Results:  Relevant Lab Results:   Additional Information SS# [id number]. Needs Hospice services to follow at facility  [facility], LCSW

## 2017-08-29 NOTE — Progress Notes (Signed)
Patient returning to [facility] ALF. Facility aware of patient's discharge and confirmed patient's ability to return with Hospice. [facility] contacted, patient's family notified. Hospice notified about transportation. Patient's RN can call report to [phone], packet complete. CSW signing off, no other needs identified.  Celso Sickle, [place] Clinical Social Worker [facility] Cell#: [phone]

## 2017-08-29 NOTE — Clinical Social Work Note (Signed)
Clinical Social Work Assessment  Patient Details  Name: [name] MRN: [id number] Date of Birth: [birth year]  Date of referral:  08/29/17               Reason for consult:  Facility Placement                Permission sought to share information with:  Case Manager Permission granted to share information::  Yes, Verbal Permission Granted  Name::        Agency::     Relationship::     Contact Information:     Housing/Transportation Living arrangements for the past 2 months:  Assisted Living Facility [facility]) Source of Information:  Adult Children Patient Interpreter Needed:  None Criminal Activity/Legal Involvement Pertinent to Current Situation/Hospitalization:  No - Comment as needed Significant Relationships:  Adult Children Lives with:  Facility Resident Do you feel safe going back to the place where you live?  Yes Need for family participation in patient care:  Yes (Comment)  Care giving concerns:  Patient from [facility]. Plan for patient to return to ALF with hospice. Patient presented to ED after being found down with an unwitnessed fall, was found to be hypothermic and acute metabolic encephalopathy.    Social Worker assessment / plan:  CSW spoke with patient's daugthers Lynden Ang [place] [phone] Ginger Duke [phone]) and discussed patient's discharge plan. Patient's daughter reported that the plan is for patient to discharge back to [facility] ALF with hospice. Patient's daughter reported that patient will need [facility] for transport.   CSW spoke with staff member Wilkie Aye from [facility]F who confirmed patient's ability to return and reported that they don't need to reassess patient prior to his return.   CSW will complete FL2 and continue to assist patient with discharge back to ALF.   Employment status:  Retired Database administrator PT Recommendations:  Not assessed at this time Information / Referral to  community resources:     Patient/Family's Response to care:  Patient's daughters both agreeable to patient's return to [facility]F with hospice.   Patient/Family's Understanding of and Emotional Response to Diagnosis, Current Treatment, and Prognosis: Patient's daughters pleasant when speaking with CSW and are involved in patient's care. Patient's daughters verbalized plan for patient to discharge back to current ALF with hospice. Patient's daughters reported that they spoke at length with palliative nurse which was helpful.   Emotional Assessment Appearance:    Attitude/Demeanor/Rapport:  Unable to Assess Affect (typically observed):  Unable to Assess Orientation:  Oriented to Self Alcohol / Substance use:  Not Applicable Psych involvement (Current and /or in the community):  No (Comment)  Discharge Needs  Concerns to be addressed:  Care Coordination Readmission within the last 30 days:  No Current discharge risk:  None Barriers to Discharge:  No Barriers Identified   Antionette Poles, LCSW 08/29/2017, 10:43 AM

## 2017-08-29 NOTE — Progress Notes (Signed)
OT Cancellation Note  Patient Details Name: [name] MRN: [id number] DOB: Oct 24, 1924   Cancelled Treatment:    Reason Eval/Treat Not Completed: Other (comment)  Noted plans for patient for purely comfort care- will sign off Lise Auer, [place] [phone]  Alba Cory 08/29/2017, 8:56 AM

## 2017-08-29 NOTE — Progress Notes (Signed)
Report called to nurse at [facility] ALF.

## 2017-08-29 NOTE — Progress Notes (Signed)
CSW contacted patient's daughter/HCPOA [employer] [phone]), to complete assessment and confirm plan for patient to discharge back to [facility]F with hospice. No answer, CSW left voicemail requesting return phone call.  CSW contacted [facility] ALF to confirm patient's ability to return, staff member Wilkie Aye agreed to contact CSW and provide update on whether patient needs to be reassessed prior to returning.   CSW awaiting return call from patient's daughter and [facility] ALF. CSW will continue to follow and assist with discharge planning.  Celso Sickle, [place] Clinical Social Worker [facility] Cell#: [phone]

## 2017-08-29 NOTE — Discharge Summary (Signed)
Physician Discharge Summary  [name]:[id number] DOB: 13-Dec-1923 DOA: 08/27/2017  PCP: Mark Corn, MD  Admit date: 08/27/2017 Discharge date: 08/29/2017  Admitted From: ALF.  Disposition:  ALF WITH hospice.   Recommendations for Outpatient Follow-up:  1. Follow up with PCP in 1-2 weeks as needed.  2. Please follow up with hospice MD  3. Family requests no blood draws or imaging.     Discharge Condition:hospice.  CODE STATUS: comfort care.  Diet recommendation: comfort feeding.   Brief/Interim Summary:  Mark Markarian Cauthrenis a 83 y.o.malewith medical history significant of dementia, type 2 diabetes, chronic kidney disease, hypertension, hyperlipidemia, anemia who presented to the ED after being found down with an unwitnessed fall, was found to be hypothermic and acute metabolic encephalopathy.  Hi hypothermia improved and on further discussing with the family, the falls having been more frequesnt and he is clinically detiorating rapidly over the last few weeks.  He has minimal po intake, confused, dementia, multiple falls, dehydration, and hypernatremic. Increased aggression and in view of the above, requested palliative care consult, who spoke with the family and opted for comfort care as the main goal.    Discharge Diagnoses:  Principal Problem:   Hypothermia Active Problems:   Type 2 diabetes mellitus (HCC)   History of aortic valve replacement with bioprosthetic valve   Falls   Anemia   CKD (chronic kidney disease), stage IV (HCC)   Goals of care, counseling/discussion   Please resume meds if he can, no further labs, no restraints,  Discharge Instructions  Discharge Instructions    Diet - low sodium heart healthy    Complete by:  As directed    Discharge instructions    Complete by:  As directed    Follow up with hospice MD AS needed.     Allergies as of 08/29/2017   No Known Allergies     Medication List    TAKE these medications   aspirin 81  MG tablet Take 81 mg by mouth daily.   clonazePAM 1 MG tablet Commonly known as:  KLONOPIN Take 1 mg by mouth at bedtime.   clonazePAM 0.5 MG tablet Commonly known as:  KLONOPIN Take 0.5 mg by mouth 2 (two) times daily.   divalproex 125 MG capsule Commonly known as:  DEPAKOTE SPRINKLE Take 250 mg by mouth 2 (two) times daily.   docusate sodium 100 MG capsule Commonly known as:  COLACE Take 100 mg by mouth daily.   eucerin cream Apply 1 application topically 2 (two) times daily.   ferrous sulfate 325 (65 FE) MG EC tablet Take 325 mg by mouth 2 (two) times daily.   hydrOXYzine 10 MG tablet Commonly known as:  ATARAX/VISTARIL Take 1 tablet (10 mg total) by mouth 3 (three) times daily.   metoprolol succinate 25 MG 24 hr tablet Commonly known as:  TOPROL-XL Take 12.5 mg by mouth daily.   PARoxetine 10 MG tablet Commonly known as:  PAXIL Take 10 mg by mouth at bedtime.   risperiDONE 1 MG/ML oral solution Commonly known as:  RISPERDAL Take 0.3 mLs (0.3 mg total) by mouth 2 (two) times daily.   torsemide 20 MG tablet Commonly known as:  DEMADEX Take 40 mg by mouth daily.   traZODone 100 MG tablet Commonly known as:  DESYREL Take 100 mg by mouth at bedtime.      Follow-up Information    [facility]-EMERGENCY DEPT Follow up.   Specialty:  Emergency Medicine Why:  If  symptoms worsen Contact information: [address] [id number] [place] [address] [place] [address] [phone]       Mark Corn, MD. Schedule an appointment as soon as possible for a visit in 1 week(s).   Specialty:  Internal Medicine Contact information: [address] [place] [address] [phone]          No Known Allergies  Consultations:  Palliative care.    Procedures/Studies: Ct Head Wo Contrast  Result Date: 08/27/2017 CLINICAL DATA:  Head trauma, found down, unwitnessed fall EXAM: CT HEAD WITHOUT CONTRAST CT CERVICAL SPINE WITHOUT  CONTRAST TECHNIQUE: Multidetector CT imaging of the head and cervical spine was performed following the standard protocol without intravenous contrast. Multiplanar CT image reconstructions of the cervical spine were also generated. COMPARISON:  08/18/2017 FINDINGS: CT HEAD FINDINGS Brain: No evidence of acute infarction, hemorrhage, hydrocephalus, extra-axial collection or mass lesion/mass effect. Global cortical atrophy. Extensive subcortical white matter and periventricular small vessel ischemic changes. Vascular: Intracranial atherosclerosis Skull: Normal. Negative for fracture or focal lesion. Sinuses/Orbits: The visualized paranasal sinuses are essentially clear. The mastoid air cells are unopacified. Other: None. CT CERVICAL SPINE FINDINGS Alignment: Normal cervical lordosis. Skull base and vertebrae: No acute fracture. No primary bone lesion or focal pathologic process. Soft tissues and spinal canal: No prevertebral fluid or swelling. No visible canal hematoma. Disc levels:  Mild multilevel degenerative changes. Upper chest: Visualized lung apices are clear. Other: Visualized thyroid is unremarkable. IMPRESSION: No evidence of acute intracranial abnormality. Atrophy with extensive small vessel ischemic changes. No evidence of traumatic injury to the cervical spine. Mild multilevel degenerative changes. Electronically Signed   By: Charline Bills M.D.   On: 08/27/2017 09:37   Ct Head Wo Contrast  Result Date: 08/18/2017 CLINICAL DATA:  Trip and fall today wall going to the restroom. Left facial laceration. Initial encounter. EXAM: CT HEAD WITHOUT CONTRAST CT CERVICAL SPINE WITHOUT CONTRAST TECHNIQUE: Multidetector CT imaging of the head and cervical spine was performed following the standard protocol without intravenous contrast. Multiplanar CT image reconstructions of the cervical spine were also generated. COMPARISON:  Three days prior FINDINGS: CT HEAD FINDINGS Brain: No evidence of acute infarction,  hemorrhage, hydrocephalus, extra-axial collection or mass lesion/mass effect. Advanced chronic small vessel ischemia with confluent gliosis in the cerebral white matter. Remote small vessel infarcts in the bilateral thalamus and upper cerebellum. Generalized atrophy, moderate to advanced. Vascular: Atherosclerotic calcification.  No hyperdense vessel. Skull: Negative for fracture. Sinuses/Orbits: No evidence of injury. CT CERVICAL SPINE FINDINGS Alignment: No traumatic malalignment. Skull base and vertebrae: Negative for acute fracture. There is T2 superior endplate horizontal sclerosis consistent with remote injury, stable. Soft tissues and spinal canal: No prevertebral fluid or swelling. No visible canal hematoma. Carotid atherosclerosis. Disc levels: Generalized degenerative disc narrowing. Notable posterior disc osteophyte complex that C4-5 and C5-6, impinging on the ventral cord. Upper chest: No acute finding IMPRESSION: 1. No evidence of acute intracranial or cervical spine injury. 2. Prominent atrophy and chronic small vessel ischemic injury. 3. 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 Electronically Signed   By: Marnee Spring M.D.   On: 08/18/2017 11:46   Ct Head Wo Contrast  Result Date: 08/15/2017 CLINICAL DATA:  Unwitnessed fall. EXAM: CT HEAD WITHOUT CONTRAST CT CERVICAL SPINE WITHOUT CONTRAST TECHNIQUE: Multidetector CT imaging of the head and cervical spine was performed following the standard protocol without intravenous contrast. Multiplanar CT image reconstructions of the cervical spine were also generated. COMPARISON:  None. FINDINGS: CT HEAD FINDINGS  Brain: Diffuse cerebral atrophy. Ventricular dilatation consistent with central atrophy. Low-attenuation changes in the deep white matter consistent small vessel ischemia. No evidence of acute infarction, hemorrhage, hydrocephalus, extra-axial collection or mass lesion/mass effect. Vascular: Vascular calcifications are  present. Skull: Calvarium appears intact. Sinuses/Orbits: Paranasal sinuses and mastoid air cells are clear. Other: None. CT CERVICAL SPINE FINDINGS Alignment: Normal. Skull base and vertebrae: No acute fracture. No primary bone lesion or focal pathologic process. Soft tissues and spinal canal: No prevertebral fluid or swelling. No visible canal hematoma. Disc levels: Degenerative changes throughout the cervical spine with narrowed interspaces and endplate hypertrophic changes throughout. Upper chest: Emphysematous changes and fibrosis in the lung apices. Aortic calcification. Other: None. IMPRESSION: 1. No acute intracranial abnormalities. Chronic atrophy and small vessel ischemic changes. 2. Normal alignment of the cervical spine. Diffuse degenerative changes. No acute displaced fractures identified. Electronically Signed   By: Burman Nieves M.D.   On: 08/15/2017 03:30   Ct Cervical Spine Wo Contrast  Result Date: 08/27/2017 CLINICAL DATA:  Head trauma, found down, unwitnessed fall EXAM: CT HEAD WITHOUT CONTRAST CT CERVICAL SPINE WITHOUT CONTRAST TECHNIQUE: Multidetector CT imaging of the head and cervical spine was performed following the standard protocol without intravenous contrast. Multiplanar CT image reconstructions of the cervical spine were also generated. COMPARISON:  08/18/2017 FINDINGS: CT HEAD FINDINGS Brain: No evidence of acute infarction, hemorrhage, hydrocephalus, extra-axial collection or mass lesion/mass effect. Global cortical atrophy. Extensive subcortical white matter and periventricular small vessel ischemic changes. Vascular: Intracranial atherosclerosis Skull: Normal. Negative for fracture or focal lesion. Sinuses/Orbits: The visualized paranasal sinuses are essentially clear. The mastoid air cells are unopacified. Other: None. CT CERVICAL SPINE FINDINGS Alignment: Normal cervical lordosis. Skull base and vertebrae: No acute fracture. No primary bone lesion or focal pathologic  process. Soft tissues and spinal canal: No prevertebral fluid or swelling. No visible canal hematoma. Disc levels:  Mild multilevel degenerative changes. Upper chest: Visualized lung apices are clear. Other: Visualized thyroid is unremarkable. IMPRESSION: No evidence of acute intracranial abnormality. Atrophy with extensive small vessel ischemic changes. No evidence of traumatic injury to the cervical spine. Mild multilevel degenerative changes. Electronically Signed   By: Charline Bills M.D.   On: 08/27/2017 09:37   Ct Cervical Spine Wo Contrast  Result Date: 08/18/2017 CLINICAL DATA:  Trip and fall today wall going to the restroom. Left facial laceration. Initial encounter. EXAM: CT HEAD WITHOUT CONTRAST CT CERVICAL SPINE WITHOUT CONTRAST TECHNIQUE: Multidetector CT imaging of the head and cervical spine was performed following the standard protocol without intravenous contrast. Multiplanar CT image reconstructions of the cervical spine were also generated. COMPARISON:  Three days prior FINDINGS: CT HEAD FINDINGS Brain: No evidence of acute infarction, hemorrhage, hydrocephalus, extra-axial collection or mass lesion/mass effect. Advanced chronic small vessel ischemia with confluent gliosis in the cerebral white matter. Remote small vessel infarcts in the bilateral thalamus and upper cerebellum. Generalized atrophy, moderate to advanced. Vascular: Atherosclerotic calcification.  No hyperdense vessel. Skull: Negative for fracture. Sinuses/Orbits: No evidence of injury. CT CERVICAL SPINE FINDINGS Alignment: No traumatic malalignment. Skull base and vertebrae: Negative for acute fracture. There is T2 superior endplate horizontal sclerosis consistent with remote injury, stable. Soft tissues and spinal canal: No prevertebral fluid or swelling. No visible canal hematoma. Carotid atherosclerosis. Disc levels: Generalized degenerative disc narrowing. Notable posterior disc osteophyte complex that C4-5 and C5-6,  impinging on the ventral cord. Upper chest: No acute finding IMPRESSION: 1. No evidence of acute intracranial or cervical spine injury. 2. Prominent  atrophy and chronic small vessel ischemic injury. 3. 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 Electronically Signed   By: Marnee Spring M.D.   On: 08/18/2017 11:46   Ct Cervical Spine Wo Contrast  Result Date: 08/15/2017 CLINICAL DATA:  Unwitnessed fall. EXAM: CT HEAD WITHOUT CONTRAST CT CERVICAL SPINE WITHOUT CONTRAST TECHNIQUE: Multidetector CT imaging of the head and cervical spine was performed following the standard protocol without intravenous contrast. Multiplanar CT image reconstructions of the cervical spine were also generated. COMPARISON:  None. FINDINGS: CT HEAD FINDINGS Brain: Diffuse cerebral atrophy. Ventricular dilatation consistent with central atrophy. Low-attenuation changes in the deep white matter consistent small vessel ischemia. No evidence of acute infarction, hemorrhage, hydrocephalus, extra-axial collection or mass lesion/mass effect. Vascular: Vascular calcifications are present. Skull: Calvarium appears intact. Sinuses/Orbits: Paranasal sinuses and mastoid air cells are clear. Other: None. CT CERVICAL SPINE FINDINGS Alignment: Normal. Skull base and vertebrae: No acute fracture. No primary bone lesion or focal pathologic process. Soft tissues and spinal canal: No prevertebral fluid or swelling. No visible canal hematoma. Disc levels: Degenerative changes throughout the cervical spine with narrowed interspaces and endplate hypertrophic changes throughout. Upper chest: Emphysematous changes and fibrosis in the lung apices. Aortic calcification. Other: None. IMPRESSION: 1. No acute intracranial abnormalities. Chronic atrophy and small vessel ischemic changes. 2. Normal alignment of the cervical spine. Diffuse degenerative changes. No acute displaced fractures identified. Electronically Signed   By: Burman Nieves M.D.   On: 08/15/2017 03:30   Dg Chest Port 1 View  Result Date: 08/27/2017 CLINICAL DATA:  Shortness of breath. Hypothermia. Evaluate for pneumonia. EXAM: PORTABLE CHEST 1 VIEW COMPARISON:  07/27/2017; 10/13/2009 FINDINGS: Grossly unchanged cardiac silhouette and mediastinal contours with atherosclerotic plaque within the thoracic aorta. There is persistent thickening of the right paratracheal stripe, presumably secondary to prominent vasculature. Post median sternotomy. No focal airspace opacities. No pleural effusion or pneumothorax. No evidence of edema. No acute osseus abnormalities. IMPRESSION: 1.  No acute cardiopulmonary disease. 2.  Aortic Atherosclerosis (ICD10-I70.0). Electronically Signed   By: Simonne Come M.D.   On: 08/27/2017 11:04   Dg Hip Unilat W Or Wo Pelvis 2-3 Views Right  Result Date: 08/27/2017 CLINICAL DATA:  Patient found down this morning. Right hip pain. Initial encounter. EXAM: DG HIP (WITH OR WITHOUT PELVIS) 2-3V RIGHT COMPARISON:  None. FINDINGS: No acute bony or joint abnormality is identified. Lower lumbar degenerative change is seen. Atherosclerosis is noted. IMPRESSION: No acute abnormality. Lower lumbar degenerative disease. Atherosclerosis. Electronically Signed   By: Drusilla Kanner M.D.   On: 08/27/2017 09:11      Subjective:  Appears calm.  Discharge Exam: Vitals:   08/28/17 1412 08/29/17 0658  BP: (!) 167/65 (!) 153/75  Pulse: 83 76  Resp:    Temp: 98.1 F (36.7 C) 98 F (36.7 C)  SpO2: 99% 100%   Vitals:   08/28/17 0615 08/28/17 0655 08/28/17 1412 08/29/17 0658  BP: (!) 147/69 (!) 138/54 (!) 167/65 (!) 153/75  Pulse: 86  83 76  Resp: 18     Temp:   98.1 F (36.7 C) 98 F (36.7 C)  TempSrc:   Oral Axillary  SpO2: 100%  99% 100%  Weight:      Height:        General: Pt is alert, awake, not in acute distress Cardiovascular: RRR, S1/S2 +, no rubs, no gallops Respiratory: CTA bilaterally, no wheezing, no rhonchi     The  results of significant diagnostics from  this hospitalization (including imaging, microbiology, ancillary and laboratory) are listed below for reference.     Microbiology: Recent Results (from the past 240 hour(s))  MRSA PCR Screening     Status: Abnormal   Collection Time: 08/27/17 11:13 PM  Result Value Ref Range Status   MRSA by PCR POSITIVE (A) NEGATIVE Final    Comment:        The GeneXpert MRSA Assay (FDA approved for NASAL specimens only), is one component of a comprehensive MRSA colonization surveillance program. It is not intended to diagnose MRSA infection nor to guide or monitor treatment for MRSA infections. RESULT CALLED TO, READ BACK BY AND VERIFIED WITH: K LOCKWOOD,RN @0114  08/28/17 MKELLY      Labs: BNP (last 3 results) No results for input(s): BNP in the last 8760 hours. Basic Metabolic Panel:  Recent Labs Lab 08/27/17 1030 08/28/17 0508  NA 146* 150*  K 4.1 4.2  CL 109 115*  CO2 26 25  GLUCOSE 110* 102*  BUN 42* 36*  CREATININE 2.28* 2.08*  CALCIUM 9.8 9.5   Liver Function Tests:  Recent Labs Lab 08/27/17 1030  AST 32  ALT 23  ALKPHOS 88  BILITOT 0.5  PROT 6.8  ALBUMIN 3.8   No results for input(s): LIPASE, AMYLASE in the last 168 hours.  Recent Labs Lab 08/27/17 1824  AMMONIA 12   CBC:  Recent Labs Lab 08/27/17 1030 08/28/17 0508  WBC 8.3 7.8  NEUTROABS 5.6  --   HGB 9.9* 9.9*  HCT 29.9* 30.4*  MCV 94.3 95.0  PLT 163 177   Cardiac Enzymes:  Recent Labs Lab 08/27/17 1030  CKTOTAL 586*   BNP: Invalid input(s): POCBNP CBG: No results for input(s): GLUCAP in the last 168 hours. D-Dimer No results for input(s): DDIMER in the last 72 hours. Hgb A1c  Recent Labs  08/28/17 0508  HGBA1C 5.6   Lipid Profile No results for input(s): CHOL, HDL, LDLCALC, TRIG, CHOLHDL, LDLDIRECT in the last 72 hours. Thyroid function studies  Recent Labs  08/27/17 1030  TSH 1.954   Anemia work up  Recent Labs   08/27/17 1824  VITAMINB12 848  FOLATE 11.4  FERRITIN 234  TIBC 253  IRON 52   Urinalysis    Component Value Date/Time   COLORURINE YELLOW 08/27/2017 1210   APPEARANCEUR CLEAR 08/27/2017 1210   LABSPEC 1.012 08/27/2017 1210   PHURINE 5.0 08/27/2017 1210   GLUCOSEU NEGATIVE 08/27/2017 1210   HGBUR NEGATIVE 08/27/2017 1210   BILIRUBINUR NEGATIVE 08/27/2017 1210   KETONESUR NEGATIVE 08/27/2017 1210   PROTEINUR NEGATIVE 08/27/2017 1210   UROBILINOGEN 0.2 09/15/2009 1201   NITRITE NEGATIVE 08/27/2017 1210   LEUKOCYTESUR NEGATIVE 08/27/2017 1210   Sepsis Labs Invalid input(s): PROCALCITONIN,  WBC,  LACTICIDVEN Microbiology Recent Results (from the past 240 hour(s))  MRSA PCR Screening     Status: Abnormal   Collection Time: 08/27/17 11:13 PM  Result Value Ref Range Status   MRSA by PCR POSITIVE (A) NEGATIVE Final    Comment:        The GeneXpert MRSA Assay (FDA approved for NASAL specimens only), is one component of a comprehensive MRSA colonization surveillance program. It is not intended to diagnose MRSA infection nor to guide or monitor treatment for MRSA infections. RESULT CALLED TO, READ BACK BY AND VERIFIED WITH: K LOCKWOOD,RN @0114  08/28/17 MKELLY      Time coordinating discharge: Over 30 minutes  SIGNED:   Kathlen Mody, MD  [facility] 08/29/2017, 12:10 PM Pager  If 7PM-7AM, please contact night-coverage [email or web address] Password [email or web address]

## 2017-09-02 LAB — CULTURE, BLOOD (ROUTINE X 2)
Culture: NO GROWTH
Culture: NO GROWTH
SPECIAL REQUESTS: ADEQUATE
Special Requests: ADEQUATE

## 2017-09-15 DEATH — deceased
